# Patient Record
Sex: Female | Born: 1950 | Race: Black or African American | Hispanic: No | Smoking: Former smoker
Health system: Southern US, Community
[De-identification: ages and names within clinical notes are randomized; demographics above are authoritative.]

## PROBLEM LIST (undated history)

## (undated) DIAGNOSIS — E039 Hypothyroidism, unspecified: Secondary | ICD-10-CM

## (undated) DIAGNOSIS — K219 Gastro-esophageal reflux disease without esophagitis: Secondary | ICD-10-CM

## (undated) DIAGNOSIS — K589 Irritable bowel syndrome without diarrhea: Secondary | ICD-10-CM

## (undated) DIAGNOSIS — M1990 Unspecified osteoarthritis, unspecified site: Secondary | ICD-10-CM

## (undated) DIAGNOSIS — E213 Hyperparathyroidism, unspecified: Secondary | ICD-10-CM

## (undated) DIAGNOSIS — E079 Disorder of thyroid, unspecified: Secondary | ICD-10-CM

## (undated) DIAGNOSIS — F0390 Unspecified dementia without behavioral disturbance: Secondary | ICD-10-CM

## (undated) DIAGNOSIS — J45909 Unspecified asthma, uncomplicated: Secondary | ICD-10-CM

## (undated) DIAGNOSIS — I10 Essential (primary) hypertension: Secondary | ICD-10-CM

## (undated) DIAGNOSIS — F329 Major depressive disorder, single episode, unspecified: Secondary | ICD-10-CM

## (undated) DIAGNOSIS — E785 Hyperlipidemia, unspecified: Secondary | ICD-10-CM

## (undated) DIAGNOSIS — F32A Depression, unspecified: Secondary | ICD-10-CM

## (undated) HISTORY — PX: COLONOSCOPY: SHX174

## (undated) HISTORY — DX: Unspecified dementia, unspecified severity, without behavioral disturbance, psychotic disturbance, mood disturbance, and anxiety: F03.90

## (undated) HISTORY — PX: CHOLECYSTECTOMY: SHX55

## (undated) HISTORY — DX: Hyperparathyroidism, unspecified: E21.3

## (undated) HISTORY — PX: VESICOVAGINAL FISTULA CLOSURE W/ TAH: SUR271

---

## 1997-12-12 ENCOUNTER — Other Ambulatory Visit: Admission: RE | Admit: 1997-12-12 | Discharge: 1997-12-12 | Payer: Self-pay | Admitting: Obstetrics

## 1998-06-03 ENCOUNTER — Ambulatory Visit (HOSPITAL_COMMUNITY): Admission: RE | Admit: 1998-06-03 | Discharge: 1998-06-04 | Payer: Self-pay | Admitting: Neurosurgery

## 1998-06-17 ENCOUNTER — Ambulatory Visit (HOSPITAL_COMMUNITY): Admission: RE | Admit: 1998-06-17 | Discharge: 1998-06-17 | Payer: Self-pay | Admitting: Neurosurgery

## 1998-06-17 ENCOUNTER — Encounter: Payer: Self-pay | Admitting: Neurosurgery

## 1998-07-02 ENCOUNTER — Ambulatory Visit (HOSPITAL_COMMUNITY): Admission: RE | Admit: 1998-07-02 | Discharge: 1998-07-02 | Payer: Self-pay | Admitting: Neurosurgery

## 1998-07-02 ENCOUNTER — Encounter: Payer: Self-pay | Admitting: Neurosurgery

## 1998-12-09 ENCOUNTER — Other Ambulatory Visit: Admission: RE | Admit: 1998-12-09 | Discharge: 1998-12-09 | Payer: Self-pay | Admitting: Obstetrics

## 1999-10-08 ENCOUNTER — Encounter: Payer: Self-pay | Admitting: Obstetrics

## 1999-10-08 ENCOUNTER — Encounter: Admission: RE | Admit: 1999-10-08 | Discharge: 1999-10-08 | Payer: Self-pay | Admitting: Obstetrics

## 2000-02-16 ENCOUNTER — Other Ambulatory Visit: Admission: RE | Admit: 2000-02-16 | Discharge: 2000-02-16 | Payer: Self-pay | Admitting: Obstetrics

## 2000-05-07 ENCOUNTER — Ambulatory Visit (HOSPITAL_COMMUNITY): Admission: RE | Admit: 2000-05-07 | Discharge: 2000-05-07 | Payer: Self-pay | Admitting: *Deleted

## 2000-10-08 ENCOUNTER — Encounter: Admission: RE | Admit: 2000-10-08 | Discharge: 2000-10-08 | Payer: Self-pay | Admitting: Obstetrics

## 2000-10-08 ENCOUNTER — Encounter: Payer: Self-pay | Admitting: Obstetrics

## 2000-12-23 ENCOUNTER — Encounter: Payer: Self-pay | Admitting: *Deleted

## 2000-12-23 ENCOUNTER — Ambulatory Visit (HOSPITAL_COMMUNITY): Admission: RE | Admit: 2000-12-23 | Discharge: 2000-12-23 | Payer: Self-pay | Admitting: *Deleted

## 2001-01-27 ENCOUNTER — Other Ambulatory Visit: Admission: RE | Admit: 2001-01-27 | Discharge: 2001-01-27 | Payer: Self-pay | Admitting: Obstetrics

## 2001-05-06 ENCOUNTER — Emergency Department (HOSPITAL_COMMUNITY): Admission: EM | Admit: 2001-05-06 | Discharge: 2001-05-06 | Payer: Self-pay | Admitting: Emergency Medicine

## 2001-05-10 ENCOUNTER — Emergency Department (HOSPITAL_COMMUNITY): Admission: EM | Admit: 2001-05-10 | Discharge: 2001-05-10 | Payer: Self-pay | Admitting: Emergency Medicine

## 2001-10-12 ENCOUNTER — Encounter: Payer: Self-pay | Admitting: Obstetrics

## 2001-10-12 ENCOUNTER — Encounter: Admission: RE | Admit: 2001-10-12 | Discharge: 2001-10-12 | Payer: Self-pay | Admitting: Obstetrics

## 2002-11-09 ENCOUNTER — Encounter: Payer: Self-pay | Admitting: Obstetrics

## 2002-11-09 ENCOUNTER — Ambulatory Visit (HOSPITAL_COMMUNITY): Admission: RE | Admit: 2002-11-09 | Discharge: 2002-11-09 | Payer: Self-pay | Admitting: Obstetrics

## 2003-04-27 ENCOUNTER — Ambulatory Visit (HOSPITAL_COMMUNITY): Admission: RE | Admit: 2003-04-27 | Discharge: 2003-04-27 | Payer: Self-pay | Admitting: *Deleted

## 2003-04-27 ENCOUNTER — Encounter: Payer: Self-pay | Admitting: *Deleted

## 2003-06-12 ENCOUNTER — Ambulatory Visit (HOSPITAL_COMMUNITY): Admission: RE | Admit: 2003-06-12 | Discharge: 2003-06-12 | Payer: Self-pay | Admitting: *Deleted

## 2003-06-12 ENCOUNTER — Encounter: Payer: Self-pay | Admitting: *Deleted

## 2007-06-02 ENCOUNTER — Ambulatory Visit (HOSPITAL_COMMUNITY): Admission: RE | Admit: 2007-06-02 | Discharge: 2007-06-02 | Payer: Self-pay | Admitting: Cardiology

## 2007-11-02 ENCOUNTER — Ambulatory Visit (HOSPITAL_COMMUNITY): Admission: RE | Admit: 2007-11-02 | Discharge: 2007-11-02 | Payer: Self-pay | Admitting: Cardiology

## 2010-01-09 ENCOUNTER — Encounter: Admission: RE | Admit: 2010-01-09 | Discharge: 2010-01-09 | Payer: Self-pay | Admitting: Cardiology

## 2010-01-14 ENCOUNTER — Encounter: Admission: RE | Admit: 2010-01-14 | Discharge: 2010-01-14 | Payer: Self-pay | Admitting: Cardiology

## 2010-05-16 ENCOUNTER — Encounter: Admission: RE | Admit: 2010-05-16 | Discharge: 2010-05-16 | Payer: Self-pay | Admitting: Cardiology

## 2010-08-07 ENCOUNTER — Emergency Department (HOSPITAL_COMMUNITY)
Admission: EM | Admit: 2010-08-07 | Discharge: 2010-08-07 | Payer: Self-pay | Source: Home / Self Care | Admitting: Emergency Medicine

## 2010-09-21 ENCOUNTER — Encounter: Payer: Self-pay | Admitting: Cardiology

## 2010-10-02 ENCOUNTER — Other Ambulatory Visit (HOSPITAL_COMMUNITY): Payer: Self-pay | Admitting: Cardiology

## 2010-10-24 ENCOUNTER — Ambulatory Visit (HOSPITAL_COMMUNITY)
Admission: RE | Admit: 2010-10-24 | Discharge: 2010-10-24 | Disposition: A | Discharge status: Home / Self Care | Payer: Medicaid Other | Source: Ambulatory Visit | Attending: Cardiology | Admitting: Cardiology

## 2010-10-24 ENCOUNTER — Ambulatory Visit (HOSPITAL_COMMUNITY): Payer: Medicaid Other

## 2010-10-24 ENCOUNTER — Ambulatory Visit (HOSPITAL_COMMUNITY): Payer: Self-pay

## 2010-10-24 DIAGNOSIS — R079 Chest pain, unspecified: Secondary | ICD-10-CM | POA: Insufficient documentation

## 2010-10-24 MED ORDER — TECHNETIUM TC 99M TETROFOSMIN IV KIT
10.0000 | PACK | Freq: Once | INTRAVENOUS | Status: AC | PRN
  Administered 2010-10-24: 10 via INTRAVENOUS

## 2010-10-24 MED ORDER — TECHNETIUM TC 99M TETROFOSMIN IV KIT
30.0000 | PACK | Freq: Once | INTRAVENOUS | Status: AC | PRN
  Administered 2010-10-24: 30 via INTRAVENOUS

## 2011-01-02 ENCOUNTER — Ambulatory Visit (HOSPITAL_BASED_OUTPATIENT_CLINIC_OR_DEPARTMENT_OTHER): Payer: Medicaid Other | Attending: Cardiology

## 2011-01-02 DIAGNOSIS — G4730 Sleep apnea, unspecified: Secondary | ICD-10-CM | POA: Insufficient documentation

## 2011-01-02 DIAGNOSIS — G4700 Insomnia, unspecified: Secondary | ICD-10-CM | POA: Insufficient documentation

## 2011-01-02 DIAGNOSIS — R0609 Other forms of dyspnea: Secondary | ICD-10-CM | POA: Insufficient documentation

## 2011-01-02 DIAGNOSIS — R0989 Other specified symptoms and signs involving the circulatory and respiratory systems: Secondary | ICD-10-CM | POA: Insufficient documentation

## 2011-01-10 DIAGNOSIS — G4700 Insomnia, unspecified: Secondary | ICD-10-CM

## 2011-01-10 DIAGNOSIS — R0989 Other specified symptoms and signs involving the circulatory and respiratory systems: Secondary | ICD-10-CM

## 2011-01-10 DIAGNOSIS — R0609 Other forms of dyspnea: Secondary | ICD-10-CM

## 2011-01-10 DIAGNOSIS — G4730 Sleep apnea, unspecified: Secondary | ICD-10-CM

## 2011-01-10 NOTE — Procedures (Signed)
[name], Alejandra Duffy               ACCOUNT NO.:  [id number]  MEDICAL RECORD NO.:  [id number]          PATIENT TYPE:  OUT  LOCATION:  SLEEP CENTER                 FACILITY:  [facility]  PHYSICIAN:  Recia Sons D. Maple Hudson, MD, FCCP, FACPDATE OF BIRTH:  06-[birth year]-49  DATE OF STUDY:  01/02/2011                           NOCTURNAL POLYSOMNOGRAM  REFERRING PHYSICIAN:  JEROME O SPRUILL  INDICATION FOR STUDY:  Insomnia with sleep apnea.  EPWORTH SLEEPINESS SCORE:  02/24, BMI 33.6.  Weight 196 pounds, height 64 inches.  Neck 14 inches.  MEDICATIONS:  Home medications are charted and reviewed.  SLEEP ARCHITECTURE:  Total sleep time 413.5 minutes with sleep efficiency 75.9%.  Stage I 28.8%, stage II 56.6%, stage III absent, REM 14.6% of total sleep time.  Sleep latency 24.5 minutes, REM latency 342.5 minutes, awake after sleep onset 82 minutes, arousal index 16.6. Bedtime medication:  None.  Sleep architecture was marked by frequent brief waking throughout the night resulting in a markedly fragmented sleep pattern.  RESPIRATORY DATA:  Apnea/hypopnea index (AHI) 2.3 per hour.  A total of 13 events was scored including 9 obstructive apneas and 4 hypopneas. RDI 4.1.  REM AHI 14.8 per hour.  There were insufficient events to permit application of CPAP titration by split study protocol on this study night.  OXYGEN DATA:  Mild snoring with oxygen desaturation to a nadir of 89% and a mean oxygen saturation through the study of 94.7% on room air.  CARDIAC DATA:  Normal sinus rhythm.  MOVEMENT/PARASOMNIA:  A few incidental limb jerks were noted with no effect on sleep recognized.  No bathroom trips.  IMPRESSION/RECOMMENDATIONS: 1. Sleep architecture was markedly fragmented throughout the study     night but without objectively recognized respiratory or movement     events.  Suggest managing as insomnia. 2. Occasional respiratory event with sleep disturbance, within normal     limits, AHI 2.3 per hour  (normal range 0 to     5 per hour).  Mild snoring with oxygen desaturation to a nadir of     89% and a mean oxygen saturation through the study of 94.7% on room     air.     Kahdijah Errickson D. Maple Hudson, MD, [facility], FACP Diplomate, Biomedical engineer of Sleep Medicine Electronically Signed    CDY/MEDQ  D:  01/10/2011 10:16:49  T:  01/10/2011 21:34:38  Job:  [id number]

## 2011-01-13 ENCOUNTER — Other Ambulatory Visit: Payer: Self-pay | Admitting: Cardiology

## 2011-01-13 DIAGNOSIS — Z1231 Encounter for screening mammogram for malignant neoplasm of breast: Secondary | ICD-10-CM

## 2011-01-16 NOTE — Procedures (Signed)
[facility]  Patient:    Alejandra Duffy, Alejandra Duffy                      MRN: [id number] Proc. Date: 05/07/00 Adm. Date:  [id number] Attending:  Sharyn Dross CC:         Lindell Spar. Chestine Spore, M.D.   Procedure Report  REFERRING PHYSICIAN:  Lindell Spar. Chestine Spore, M.D.  PREOPERATIVE DIAGNOSIS:  Diarrhea, etiology unknown.  POSTOPERATIVE DIAGNOSIS:  Grossly normal colonoscopic examination, except increased firm stools present in the colon.  PROCEDURE:  Colonoscopy.  MEDICATIONS:  Demerol 100 mg IV, Versed 10 mg IV over 20 minute period of time.  INSTRUMENT:  Olympus video pancolonoscope.  ENDOSCOPIST:  Sharyn Dross., M.D.  INDICATIONS:  This pleasant 60 year old black female was referred for an evaluation because of diarrhea that has been ongoing.  She was seen by her primary physician that subsequently referred her for an evaluation for this process.  The patient denies any history of any blood present in the stools at this time.  There is no evidence of any hematemesis or any melanotic stools as noted.  OBJECTIVE FINDINGS:  She is a pleasant female, who appeared to be in no acute distress.  Her vital signs were stable.  Her HEENT examination was anicteric. NECK was supple.  LUNGS were clear.  HEART had a regular rate and rhythm without heaves, thrills, murmurs or gallops.  The ABDOMEN was soft, no tenderness, no hepatosplenomegaly appreciated.  Digital rectal examination was normal.  EXTREMITIES showed no clubbing, cyanosis, or edema.  PLAN:  I am going to proceed with the colonoscopic examination.  INFORMED CONSENT:  Patient was advised of the procedure, indications, and risks involved.  Patient has agreed to have the procedure performed.  A video was reviewed and consent form was obtained.  PREOPERATIVE PREPARATION:  Patient was brought in the endoscopy unit when an IV for IV sedating medication was started.  A monitor was placed on the patient to  monitor the patients vital signs and oxygen saturation.  Nasal oxygen at approximately 2 L/min was used and after adequate sedation was performed, the procedure was begun.  BOWEL PREP:  No bowel prep was given to the patient because of the diarrhea that was present.  PROCEDURE NOTE:  The instrument was advanced, patient lying in left lateral position, to approximately 130 cm proximal colon to the proximal transverse/ascending region.  There appeared to be no gross abnormalities, such as masses, polyps or stricture lesions appreciated.  The vascular pattern that was visualized appeared to be well within normal limits throughout the entire area that was evaluated.  The mucosal pattern showed no evidence of any granular changes or diverticular changes noted.  There was however, large amounts of stool that was present.  These amounts were in varied positions at this time.  Some areas were clear and other areas were packed with stools that was present.  There was no increased fluid that was present with the stools that were noted, thus the stools were very firm in character at this time.  There was no increased tortuosity of the colon that was noted.  As the instrument was advanced more proximally, there was more and more stool that was present.  Thus, the procedure was halted because of poor visualization that was noted.  The instrument was retracted back with no other gross abnormalities noted and the instrument was removed per rectum without difficulty with the patient tolerating the  procedure well.  TREATMENT: 1. I am going to recommend a stool evacuating system to empty her out at this    time. 2. I am going to also recommend high-fiber diet at this time. 3. A prokinetic would benefit her at this point.  However, based on the fact    that we presently do not have one available would just observe her and    depending on these results will determine the course of therapy.  LEVEL OF  DIFFICULTY:  The level of difficulty was 3/5.  RECOMMENDATIONS: 1. Would recommend because of some increased tortuosity that was present, the    pediatric scope may be of more benefit. 2. Based on her thrashing about during the procedure would recommend possibly    anesthesia to help with repeating this procedure as deemed necessary. DD:  05/07/00 TD:  05/08/00 Job: [id number]

## 2011-02-18 ENCOUNTER — Ambulatory Visit: Payer: Medicaid Other

## 2011-03-06 ENCOUNTER — Ambulatory Visit
Admission: RE | Admit: 2011-03-06 | Discharge: 2011-03-06 | Disposition: A | Discharge status: Home / Self Care | Payer: Medicaid Other | Source: Ambulatory Visit | Attending: Cardiology | Admitting: Cardiology

## 2011-03-06 DIAGNOSIS — Z1231 Encounter for screening mammogram for malignant neoplasm of breast: Secondary | ICD-10-CM

## 2011-05-25 LAB — URINALYSIS, MICROSCOPIC ONLY
Glucose, UA: NEGATIVE
Ketones, ur: 15 — AB
Urobilinogen, UA: 1
pH: 6

## 2011-05-25 LAB — CBC
Hemoglobin: 14
MCHC: 33.9
MCV: 88.2
RBC: 4.69
WBC: 5.1

## 2011-05-25 LAB — COMPREHENSIVE METABOLIC PANEL
ALT: 34
Calcium: 8.3 — ABNORMAL LOW
GFR calc Af Amer: >60
Potassium: 3.2 — ABNORMAL LOW
Sodium: 137
Total Bilirubin: 0.5
Total Protein: 6.2

## 2011-05-25 LAB — LIPID PANEL
Cholesterol: 116
LDL Cholesterol: 43
Total CHOL/HDL Ratio: 4.8

## 2011-05-25 LAB — TSH: TSH: 2.576

## 2011-11-27 ENCOUNTER — Emergency Department (HOSPITAL_COMMUNITY)
Admission: EM | Admit: 2011-11-27 | Discharge: 2011-11-27 | Disposition: A | Discharge status: Home / Self Care | Payer: Medicaid Other | Attending: Emergency Medicine | Admitting: Emergency Medicine

## 2011-11-27 ENCOUNTER — Encounter (HOSPITAL_COMMUNITY): Payer: Self-pay | Admitting: *Deleted

## 2011-11-27 ENCOUNTER — Emergency Department (HOSPITAL_COMMUNITY): Payer: Medicaid Other

## 2011-11-27 DIAGNOSIS — E119 Type 2 diabetes mellitus without complications: Secondary | ICD-10-CM | POA: Insufficient documentation

## 2011-11-27 DIAGNOSIS — M79609 Pain in unspecified limb: Secondary | ICD-10-CM | POA: Insufficient documentation

## 2011-11-27 DIAGNOSIS — M79676 Pain in unspecified toe(s): Secondary | ICD-10-CM

## 2011-11-27 DIAGNOSIS — I10 Essential (primary) hypertension: Secondary | ICD-10-CM | POA: Insufficient documentation

## 2011-11-27 DIAGNOSIS — E785 Hyperlipidemia, unspecified: Secondary | ICD-10-CM | POA: Insufficient documentation

## 2011-11-27 DIAGNOSIS — W19XXXA Unspecified fall, initial encounter: Secondary | ICD-10-CM | POA: Insufficient documentation

## 2011-11-27 HISTORY — DX: Hyperlipidemia, unspecified: E78.5

## 2011-11-27 HISTORY — DX: Essential (primary) hypertension: I10

## 2011-11-27 HISTORY — DX: Disorder of thyroid, unspecified: E07.9

## 2011-11-27 MED ORDER — OXYCODONE-ACETAMINOPHEN 5-325 MG PO TABS
1.0000 | ORAL_TABLET | Freq: Once | ORAL | Status: AC
  Administered 2011-11-27: 1 via ORAL
  Filled 2011-11-27: qty 1

## 2011-11-27 MED ORDER — OXYCODONE-ACETAMINOPHEN 5-325 MG PO TABS: 1.0000 | ORAL_TABLET | Freq: Four times a day (QID) | ORAL | Status: AC | PRN

## 2011-11-27 NOTE — ED Notes (Signed)
Pt now in room.   

## 2011-11-27 NOTE — ED Notes (Signed)
Pt not in room x2 

## 2011-11-27 NOTE — ED Notes (Signed)
Pt not found in room for d/c.

## 2011-11-27 NOTE — ED Notes (Signed)
Ortho Tech coming to provide crutches to pt.

## 2011-11-27 NOTE — ED Notes (Signed)
Patient transported to X-ray 

## 2011-11-27 NOTE — Discharge Instructions (Signed)
Conservative treatment includes the following home care instructions. HOME CARE INSTRUCTIONS   Apply ice to the sore area for 15 to 20 minutes, 3 to 4 times per day while awake, for the first 4 days. Put the ice in a plastic bag and place a towel between the bag of ice and your skin. Use ice if possible following any activities, even after the first four days.   Keep your leg elevated when possible to lessen swelling and discomfort in the toe.   Use crutches with non-weight bearing on the affected foot for ten days, or as needed for pain. Then you may walk as the pain allows, or as instructed. Start gradually with weight bearing on the affected foot. Shoes with stiff soles will generally be helpful in limiting pain for the first 1 to 2 weeks.   Continue to use crutches or a cane until you can stand on your foot without causing pain.   Only take over-the-counter or prescription medicines for pain, discomfort, or fever as directed by your caregiver.  SEEK IMMEDIATE MEDICAL CARE IF:   You have an increase in bruising, swelling, or pain in your toe.   Pain relief is not obtained with medications.  Document Released: 02/06/2002 Document Revised: 08/06/2011 Document Reviewed: 01/22/2009 [facility] Patient Information 2012 [place], [facility]  Crutch Use You have been prescribed crutches to take weight off one of your lower legs or feet (extremities). When using crutches, make sure you are not putting pressure on the armpit (axilla). This could cause damage to the nerves that extend from your axilla to the hand and arm. When fitted properly the crutches should be 2 to 3 finger widths below the axilla. Your weight should be supported by your hand, and not by resting upon the crutch with the axilla. When walking, first step with the crutches, then swing the healthy leg through and slightly ahead. When going up stairs, first step up with the healthy leg and then follow with the crutches and injured leg up to  the same step, and so forth. If there is a handrail, hold both crutches in one hand, place your other hand on the handrail, and while placing your weight on your arms, lift your good leg to the step, then bring the crutches and the injured leg up to that step. Repeat for each step. When going down stairs, first step with the injured leg and crutches, following down with the healthy leg to the same step. Be very careful, as going down stairs with crutches is very challenging. If you feel wobbly or nervous, sit down and inch yourself down the stairs on your butt. To get up from a chair, hold injured leg forward, grab armrest with one hand and the top of the crutches with the other hand. Using these supports, pull yourself up to a standing position. Reverse this procedure for sitting. See your caregiver for follow up as suggested. If you are discharged in an ace wrap and develop numbness, tingling, swelling, or increased pain, loosen the ace wrap and re-wrap looser. If these problems persist, see your caregiver as needed. If you have been instructed to use partial weight bearing, bear (apply) the amount of weight as suggested by your caregiver. Do not bear weight in an amount that causes pain on the area of injury. Document Released: 08/14/2000 Document Revised: 08/06/2011 Document Reviewed: 10/22/2008 [facility] Patient Information 2012 [place], [place].Marland Kitchen

## 2011-11-27 NOTE — ED Notes (Signed)
Pt undischarged, pt states she was never called back to a room, will place pt in CDU 9

## 2011-11-27 NOTE — ED Provider Notes (Signed)
Medical screening examination/treatment/procedure(s) were performed by non-physician practitioner and as supervising physician I was immediately available for consultation/collaboration.    Nelia Shi, MD 11/27/11 [phone]

## 2011-11-27 NOTE — Progress Notes (Signed)
Orthopedic Tech Progress Note Patient Details:  Alejandra Duffy 31-Dec-1950 [id number]  Other Ortho Devices Type of Ortho Device: Crutches Ortho Device Location: (R) LE Ortho Device Interventions: Casandra Doffing 11/27/2011, 5:59 PM

## 2011-11-27 NOTE — ED Provider Notes (Signed)
History     CSN: [id number]  Arrival date & time 11/27/11  1257   First MD Initiated Contact with Patient 11/27/11 1356      Chief Complaint  Patient presents with  . Fall  . Foot Pain    (Consider location/radiation/quality/duration/timing/severity/associated sxs/prior treatment) Patient is a 61 y.o. female presenting with fall and lower extremity pain. The history is provided by the patient. No language interpreter was used.  Fall The accident occurred yesterday. The fall occurred while walking. She fell from a height of 1 to 2 ft. She landed on a hard floor. There was no blood loss. The pain is moderate. She was ambulatory at the scene. There was no entrapment after the fall. There was no drug use involved in the accident. There was no alcohol use involved in the accident. The symptoms are aggravated by activity. She has tried nothing for the symptoms.  Foot Pain This is a new problem. The current episode started yesterday. The problem occurs intermittently. The problem has been gradually worsening. Associated symptoms include arthralgias. Pertinent negatives include no joint swelling. The symptoms are aggravated by walking. She has tried rest for the symptoms. The treatment provided no relief.    Past Medical History  Diagnosis Date  . Hypertension   . Diabetes mellitus   . Gout   . Thyroid disease   . Hyperlipemia     History reviewed. No pertinent past surgical history.  No family history on file.  History  Substance Use Topics  . Smoking status: Never Smoker   . Smokeless tobacco: Not on file  . Alcohol Use: No    OB History    Grav Para Term Preterm Abortions TAB SAB Ect Mult Living                  Review of Systems  Musculoskeletal: Positive for arthralgias. Negative for joint swelling.  All other systems reviewed and are negative.    Allergies  Tetracyclines & related  Home Medications   Current Outpatient Rx  Name Route Sig Dispense Refill  .  CARVEDILOL 25 MG PO TABS Oral Take 25 mg by mouth 2 (two) times daily with a meal.    . CHLORHEXIDINE GLUCONATE 0.12 % MT SOLN Mouth/Throat Use as directed 15 mLs in the mouth or throat as needed.    Marland Kitchen CITALOPRAM HYDROBROMIDE 40 MG PO TABS Oral Take 40 mg by mouth daily.    . COLCHICINE 0.6 MG PO TABS Oral Take 0.6 mg by mouth 2 (two) times daily as needed.    . CYCLOBENZAPRINE HCL 10 MG PO TABS Oral Take 10 mg by mouth at bedtime as needed. For muscle pain    . DICYCLOMINE HCL 20 MG PO TABS Oral Take 20 mg by mouth 2 (two) times daily.    Marland Kitchen GLIMEPIRIDE 4 MG PO TABS Oral Take 4 mg by mouth 2 (two) times daily.    Marland Kitchen HYDROCODONE-ACETAMINOPHEN 7.5-500 MG PO TABS Oral Take 1 tablet by mouth every 6 (six) hours as needed. For pain    . HYDROCORTISONE 2.5 % EX CREA Topical Apply 1 application topically 2 (two) times daily as needed.    Marland Kitchen LEVOTHYROXINE SODIUM 50 MCG PO TABS Oral Take 50 mcg by mouth daily.    Marland Kitchen LORAZEPAM 1 MG PO TABS Oral Take 1 mg by mouth daily.    Marland Kitchen OMEPRAZOLE 20 MG PO CPDR Oral Take 20 mg by mouth 2 (two) times daily.    Marland Kitchen SIMVASTATIN 40  MG PO TABS Oral Take 40 mg by mouth every evening.    Marland Kitchen SITAGLIPTIN PHOSPHATE 100 MG PO TABS Oral Take 100 mg by mouth daily.    . TRAMADOL HCL 50 MG PO TABS Oral Take 50 mg by mouth every 6 (six) hours as needed. For pain    . TRIAMTERENE-HCTZ 37.5-25 MG PO TABS Oral Take 1 tablet by mouth daily.      BP 91/62  Pulse 94  Temp(Src) 97.8 F (36.6 C) (Oral)  Resp 20  Ht 5\' 4"  (1.626 m)  Wt 180 lb (81.647 kg)  BMI 30.90 kg/m2  SpO2 96%  Physical Exam  Nursing note and vitals reviewed. Constitutional: She is oriented to person, place, and time. She appears well-developed and well-nourished.  HENT:  Head: Normocephalic and atraumatic.  Eyes: Conjunctivae are normal. Pupils are equal, round, and reactive to light.  Neck: Normal range of motion. Neck supple.  Cardiovascular: Normal rate, regular rhythm, normal heart sounds and intact distal  pulses.   Pulmonary/Chest: Effort normal and breath sounds normal.  Abdominal: Soft. Bowel sounds are normal.  Musculoskeletal: She exhibits tenderness.       Right foot: She exhibits decreased range of motion and tenderness. She exhibits no swelling, normal capillary refill and no deformity.       Feet:  Lymphadenopathy:    She has no cervical adenopathy.  Neurological: She is alert and oriented to person, place, and time.  Skin: Skin is warm and dry.  Psychiatric: She has a normal mood and affect. Her behavior is normal. Judgment and thought content normal.    ED Course  Procedures (including critical care time)  Labs Reviewed - No data to display No results found.   No diagnosis found.  3:01 PM Radiology results reviewed--no fracture or other bony abnormality noted.  Patient not currently in room.  3:26 PM  Patient not in room, and has not been located in the department.  Apparently left post-evaluation.  5:38 PM Patient seen after being located in waiting area.  Results discussed with patient.  Will treat at turf toe.  Patient is on chronic pain medication at home.  MDM          Jimmye Norman, NP 11/27/11 1527  Jimmye Norman, NP 11/27/11 1744

## 2011-11-27 NOTE — ED Notes (Signed)
Reports last night, missed a step going down & landed on toes. Denies other injuries.  C/o right great toe pain. No obvious deformity, edema noted. Ambulatory from triage

## 2011-11-27 NOTE — ED Notes (Signed)
Patient reports she missed stepped on yesterday and hurt her right great toe.  She denies any other injuries

## 2011-11-27 NOTE — ED Notes (Signed)
Pt not in room.

## 2012-01-22 ENCOUNTER — Other Ambulatory Visit: Payer: Self-pay | Admitting: Cardiology

## 2012-01-22 DIAGNOSIS — Z1231 Encounter for screening mammogram for malignant neoplasm of breast: Secondary | ICD-10-CM

## 2012-02-03 ENCOUNTER — Other Ambulatory Visit (HOSPITAL_COMMUNITY): Payer: Self-pay | Admitting: Cardiology

## 2012-02-03 DIAGNOSIS — R079 Chest pain, unspecified: Secondary | ICD-10-CM

## 2012-02-08 ENCOUNTER — Ambulatory Visit (HOSPITAL_COMMUNITY)
Admission: RE | Admit: 2012-02-08 | Discharge: 2012-02-08 | Disposition: A | Discharge status: Home / Self Care | Payer: Medicaid Other | Source: Ambulatory Visit | Attending: Cardiology | Admitting: Cardiology

## 2012-02-08 ENCOUNTER — Encounter (HOSPITAL_COMMUNITY)
Admission: RE | Admit: 2012-02-08 | Discharge: 2012-02-08 | Disposition: A | Discharge status: Home / Self Care | Payer: Medicaid Other | Source: Ambulatory Visit | Attending: Cardiology | Admitting: Cardiology

## 2012-02-08 ENCOUNTER — Other Ambulatory Visit: Payer: Self-pay

## 2012-02-08 DIAGNOSIS — I10 Essential (primary) hypertension: Secondary | ICD-10-CM | POA: Insufficient documentation

## 2012-02-08 DIAGNOSIS — E119 Type 2 diabetes mellitus without complications: Secondary | ICD-10-CM | POA: Insufficient documentation

## 2012-02-08 DIAGNOSIS — R079 Chest pain, unspecified: Secondary | ICD-10-CM

## 2012-02-08 MED ORDER — REGADENOSON 0.4 MG/5ML IV SOLN
INTRAVENOUS | Status: AC
  Administered 2012-02-08: 0.4 mg via INTRAVENOUS
  Filled 2012-02-08: qty 5

## 2012-02-08 MED ORDER — TECHNETIUM TC 99M TETROFOSMIN IV KIT
30.0000 | PACK | Freq: Once | INTRAVENOUS | Status: AC | PRN
  Administered 2012-02-08: 30 via INTRAVENOUS

## 2012-02-08 MED ORDER — REGADENOSON 0.4 MG/5ML IV SOLN
0.4000 mg | Freq: Once | INTRAVENOUS | Status: AC
  Administered 2012-02-08: 0.4 mg via INTRAVENOUS

## 2012-02-08 MED ORDER — TECHNETIUM TC 99M TETROFOSMIN IV KIT
10.0000 | PACK | Freq: Once | INTRAVENOUS | Status: AC | PRN
  Administered 2012-02-08: 10 via INTRAVENOUS

## 2012-03-08 ENCOUNTER — Ambulatory Visit
Admission: RE | Admit: 2012-03-08 | Discharge: 2012-03-08 | Disposition: A | Discharge status: Home / Self Care | Payer: Medicaid Other | Source: Ambulatory Visit | Attending: Cardiology | Admitting: Cardiology

## 2012-03-08 DIAGNOSIS — Z1231 Encounter for screening mammogram for malignant neoplasm of breast: Secondary | ICD-10-CM

## 2012-03-14 ENCOUNTER — Encounter: Payer: Self-pay | Admitting: Pulmonary Disease

## 2012-03-15 ENCOUNTER — Institutional Professional Consult (permissible substitution): Payer: Medicaid Other | Admitting: Internal Medicine

## 2012-04-01 ENCOUNTER — Encounter: Payer: Self-pay | Admitting: *Deleted

## 2012-04-04 ENCOUNTER — Encounter: Payer: Self-pay | Admitting: Internal Medicine

## 2012-04-04 ENCOUNTER — Ambulatory Visit (INDEPENDENT_AMBULATORY_CARE_PROVIDER_SITE_OTHER): Payer: Medicaid Other | Admitting: Internal Medicine

## 2012-04-04 VITALS — BP 112/80 | HR 89 | Temp 97.9°F | Ht 64.0 in | Wt 197.4 lb

## 2012-04-04 DIAGNOSIS — R059 Cough, unspecified: Secondary | ICD-10-CM | POA: Insufficient documentation

## 2012-04-04 DIAGNOSIS — R05 Cough: Secondary | ICD-10-CM

## 2012-04-04 DIAGNOSIS — I10 Essential (primary) hypertension: Secondary | ICD-10-CM | POA: Insufficient documentation

## 2012-04-04 DIAGNOSIS — E1159 Type 2 diabetes mellitus with other circulatory complications: Secondary | ICD-10-CM | POA: Insufficient documentation

## 2012-04-04 MED ORDER — VALSARTAN 80 MG PO TABS: 80.0000 mg | ORAL_TABLET | Freq: Every day | ORAL | Status: DC

## 2012-04-04 NOTE — Progress Notes (Signed)
  Subjective:    Patient ID: Alejandra Duffy, female    DOB: [birth year]   MRN: [id number]  HPI  61 yobf never regular smoker with h/o sinus infections x decades then " turned into asthma" early 2000s mostly rx with abx and neb daily then worse symptoms of cough around May 2013 so referred 04/04/2012 to pulmonary clinic by  Margaretmary Bayley PA/ Dr Melven Sartorius at [facility]   04/04/2012 1st pulmonary eval on ace cc difficult to control cough x 3 month 24 h per day but worse daytime when get in car and when lie down at night > min prod slt beige mucus rx zpack and tessilon not effective so changed to liquid cough medication that worked until she ran out of it.  No better now with neb albuterol whereas previously helped quite a bit.  No overt sinus or reflux symptoms, no flare of "sinus infection" assoc with the cough but is much more hoarse now.  Sleeping ok without   early am exacerbation  Of cough/ wheeze or other respiratory  c/o's or need for noct saba. Also denies any obvious fluctuation of symptoms with weather or environmental changes or other aggravating or alleviating factors except as outlined above   Review of Systems  Constitutional: Negative for fever, chills and unexpected weight change.  HENT: Negative for ear pain, nosebleeds, congestion, sore throat, rhinorrhea, sneezing, trouble swallowing, dental problem, voice change, postnasal drip and sinus pressure.   Eyes: Negative for visual disturbance.  Respiratory: Positive for cough and shortness of breath. Negative for choking.   Cardiovascular: Negative for chest pain and leg swelling.  Gastrointestinal: Positive for abdominal pain. Negative for vomiting and diarrhea.  Genitourinary: Negative for difficulty urinating.  Musculoskeletal: Positive for arthralgias.  Skin: Negative for rash.  Neurological: Negative for tremors, syncope and headaches.  Hematological: Does not bruise/bleed easily.       Objective:   Physical Exam  Wt  Readings from Last 3 Encounters:  04/04/12 197 lb 6.4 oz (89.54 kg)  11/27/11 180 lb (81.647 kg)    Very hoarse bf with classic voice fatigue with mild voice fatigue   HEENT: nl dentition, turbinates, and orophanx. Nl external ear canals without cough reflex   NECK :  without JVD/Nodes/TM/ nl carotid upstrokes bilaterally   LUNGS: no acc muscle use, clear to A and P bilaterally without cough on insp or exp maneuvers   CV:  RRR  no s3 or murmur or increase in P2, no edema   ABD:  soft and nontender with nl excursion in the supine position. No bruits or organomegaly, bowel sounds nl  MS:  warm without deformities, calf tenderness, cyanosis or clubbing  SKIN: warm and dry without lesions    NEURO:  alert, approp, no deficits          Assessment & Plan:

## 2012-04-04 NOTE — Patient Instructions (Addendum)
Stop lisinopril and replace it with diovan (valsartan) 80 mg one daily (we will phone it in)  Change omeprazole 20 mg Take 30- 60 min before your first and last meals of the day   GERD (REFLUX)  is an extremely common cause of respiratory symptoms, many times with no significant heartburn at all.    It can be treated with medication, but also with lifestyle changes including avoidance of late meals, excessive alcohol, smoking cessation, and avoid fatty foods, chocolate, peppermint, colas, red wine, and acidic juices such as orange juice.  NO MINT OR MENTHOL PRODUCTS SO NO COUGH DROPS  USE SUGARLESS CANDY INSTEAD (jolley ranchers or Stover's)  NO OIL BASED VITAMINS - use powdered substitutes.   If still coughing ok to use tramadol to control it but it should it go away after a week or two.  Please schedule a follow up office visit in 4 weeks, sooner if needed

## 2012-04-04 NOTE — Assessment & Plan Note (Signed)
ACE inhibitors are problematic in  pts with airway complaints because  even experienced pulmonologists can't always distinguish ace effects from copd/asthma.  By themselves they don't actually cause a problem, much like oxygen can't by itself start a fire, but they certainly serve as a powerful catalyst or enhancer for any "fire"  or inflammatory process in the upper airway, be it caused by an ET  tube or more commonly reflux (especially in the obese or pts with known GERD or who are on biphoshonates).    In the era of ARB near equivalency until we have a better handle on the reversibility of the airway problem, it just makes sense to avoid ACEI  entirely in the short run and then decide later, having established a level of airway control using a reasonable limited regimen, whether to add back ace but even then being very careful to observe the pt for worsening airway control and number of meds used/ needed to control symptoms.    For now change to Diovan generic 80 mg one daily

## 2012-04-04 NOTE — Assessment & Plan Note (Signed)
The most common causes of chronic cough in immunocompetent adults include the following: upper airway cough syndrome (UACS), previously referred to as postnasal drip syndrome (PNDS), which is caused by variety of rhinosinus conditions; (2) asthma; (3) GERD; (4) chronic bronchitis from cigarette smoking or other inhaled environmental irritants; (5) nonasthmatic eosinophilic bronchitis; and (6) bronchiectasis.   These conditions, singly or in combination, have accounted for up to 94% of the causes of chronic cough in prospective studies.   Other conditions have constituted no >6% of the causes in prospective studies These have included bronchogenic carcinoma, chronic interstitial pneumonia, sarcoidosis, left ventricular failure, ACEI-induced cough, and aspiration from a condition associated with pharyngeal dysfunction.   chronic cough is often simultaneously caused by more than one condition. A single cause has been found from 38 to 82% of the time, multiple causes from 18 to 62%. Multiply caused cough has been the result of three diseases up to 42% of the time.     Most likely this is  Classic Upper airway cough syndrome, so named because it's frequently impossible to sort out how much is  CR/sinusitis with freq throat clearing (which can be related to primary GERD)   vs  causing  secondary (" extra esophageal")  GERD from wide swings in gastric pressure that occur with throat clearing, often  promoting self use of mint and menthol lozenges that reduce the lower esophageal sphincter tone and exacerbate the problem further in a cyclical fashion.   These are the same pts (now being labeled as having "irritable larynx syndrome" by some cough centers) who not infrequently have a history of having failed to tolerate ace inhibitors,  dry powder inhalers or biphosphonates or report having atypical reflux symptoms that don't respond to standard doses of PPI , and are easily confused as having aecopd or asthma  flares by even experienced allergists/ pulmonologists.  For now needs trial off ACEI then regroup in 4-6 weeks.

## 2012-05-04 ENCOUNTER — Other Ambulatory Visit (INDEPENDENT_AMBULATORY_CARE_PROVIDER_SITE_OTHER): Payer: Medicaid Other

## 2012-05-04 ENCOUNTER — Encounter: Payer: Self-pay | Admitting: Internal Medicine

## 2012-05-04 ENCOUNTER — Ambulatory Visit (INDEPENDENT_AMBULATORY_CARE_PROVIDER_SITE_OTHER): Payer: Medicaid Other | Admitting: Internal Medicine

## 2012-05-04 VITALS — BP 118/80 | HR 82 | Temp 98.0°F | Ht 64.0 in | Wt 199.6 lb

## 2012-05-04 DIAGNOSIS — R0609 Other forms of dyspnea: Secondary | ICD-10-CM

## 2012-05-04 DIAGNOSIS — I10 Essential (primary) hypertension: Secondary | ICD-10-CM

## 2012-05-04 DIAGNOSIS — R0600 Dyspnea, unspecified: Secondary | ICD-10-CM

## 2012-05-04 DIAGNOSIS — R0989 Other specified symptoms and signs involving the circulatory and respiratory systems: Secondary | ICD-10-CM

## 2012-05-04 DIAGNOSIS — R05 Cough: Secondary | ICD-10-CM

## 2012-05-04 LAB — CBC WITH DIFFERENTIAL/PLATELET
Basophils Relative: 0.8 % (ref 0.0–3.0)
Eosinophils Absolute: 0.1 10*3/uL (ref 0.0–0.7)
Eosinophils Relative: 2.3 % (ref 0.0–5.0)
HCT: 35.9 % — ABNORMAL LOW (ref 36.0–46.0)
Hemoglobin: 11.7 g/dL — ABNORMAL LOW (ref 12.0–15.0)
Lymphs Abs: 1.6 10*3/uL (ref 0.7–4.0)
MCHC: 32.4 g/dL (ref 30.0–36.0)
MCV: 89.7 fl (ref 78.0–100.0)
Monocytes Absolute: 0.4 10*3/uL (ref 0.1–1.0)
Neutro Abs: 3 10*3/uL (ref 1.4–7.7)
RBC: 4.01 Mil/uL (ref 3.87–5.11)
WBC: 5.1 10*3/uL (ref 4.5–10.5)

## 2012-05-04 LAB — BASIC METABOLIC PANEL
BUN: 8 mg/dL (ref 6–23)
Creatinine, Ser: 1 mg/dL (ref 0.4–1.2)
GFR: 76.41 mL/min (ref >60.00)
Glucose, Bld: 122 mg/dL — ABNORMAL HIGH (ref 70–99)

## 2012-05-04 LAB — TSH: TSH: 1.03 u[IU]/mL (ref 0.35–5.50)

## 2012-05-04 MED ORDER — NEBIVOLOL HCL 20 MG PO TABS: ORAL_TABLET | ORAL | Status: DC

## 2012-05-04 MED ORDER — TRAMADOL HCL 50 MG PO TABS: 50.0000 mg | ORAL_TABLET | Freq: Four times a day (QID) | ORAL | Status: DC | PRN

## 2012-05-04 NOTE — Patient Instructions (Addendum)
Stop coreg  Start bystolic 20 mg one daily  Take delsym two tsp every 12 hours and supplement if needed with  tramadol 50 mg up to 2 every 4 hours to suppress the urge to cough. Swallowing water or using ice chips/non mint and menthol containing candies (such as lifesavers or sugarless jolly ranchers) are also effective.  You should rest your voice and avoid activities that you know make you cough.  Once you have eliminated the cough for 3 straight days try reducing the tramadol first,  then the delsym as tolerated.    Please schedule a follow up office visit in 4 weeks, sooner if needed with pft's Late add needs cxr also

## 2012-05-04 NOTE — Progress Notes (Signed)
  Subjective:    Patient ID: Alejandra Duffy, female    DOB: 1950-09-01   MRN: [id number]  HPI  47 yobf never regular smoker with h/o sinus infections x decades then " turned into asthma" early 2000s mostly rx with abx and neb daily then worse symptoms of cough around May 2013 so referred 04/04/2012 to pulmonary clinic by  Margaretmary Bayley PA/ Dr Melven Sartorius at [facility]   04/04/2012 1st pulmonary eval on acei cc difficult to control cough x 3 month 24 h per day but worse daytime when get in car and when lie down at night > min prod slt beige mucus rx zpack and tessilon not effective so changed to liquid cough medication that worked until she ran out of it.  No better now with neb albuterol whereas previously helped quite a bit.  No overt sinus or reflux symptoms, no flare of "sinus infection" assoc with the cough but is much more hoarse now. rec Stop lisinopril and replace it with diovan (valsartan) 80 mg one daily (we will phone it in) Change omeprazole 20 mg Take 30- 60 min before your first and last meals of the day  GERD  diet. If still coughing ok to use tramadol to control it but it should it go away after a week or two   05/04/2012 f/u ov/[name] cc cough a lot better not using tramadol x for pain. Doe x cleaning x years, more in fall, some better p inhaler despite coreg x years. Not sure what came first,the sob or the coreg.  No obvious daytime variabilty or assoc excess sputum production or cp or chest tightness, subjective wheeze overt sinus or hb symptoms. No unusual exp hx or h/o childhood pna/ asthma or premature birth to his knowledge.     Sleeping ok without   early am exacerbation  Of cough/ wheeze or other respiratory  c/o's or need for noct saba. Also denies any obvious fluctuation of symptoms with weather or environmental changes or other aggravating or alleviating factors except as outlined above   .       Objective:   Physical Exam  Wt 05/04/2012  Wt Readings from Last 3  Encounters:  04/04/12 197 lb 6.4 oz (89.54 kg)  11/27/11 180 lb (81.647 kg)    Min hoarse bf  with mild voice fatigue   HEENT: nl dentition, turbinates, and orophanx. Nl external ear canals without cough reflex   NECK :  without JVD/Nodes/TM/ nl carotid upstrokes bilaterally   LUNGS: no acc muscle use, clear to A and P bilaterally without cough on insp or exp maneuvers   CV:  RRR  no s3 or murmur or increase in P2, no edema   ABD:  soft and nontender with nl excursion in the supine position. No bruits or organomegaly, bowel sounds nl  MS:  warm without deformities, calf tenderness, cyanosis or clubbing       05/04/12 Labs All  nl including hc03 tsh and bnp, hgb       Assessment & Plan:

## 2012-05-05 NOTE — Assessment & Plan Note (Addendum)
All labs nl including hc03 tsh and bnp, hgb   Symptoms are markedly disproportionate to objective findings and not clear this is a lung problem but pt does appear to have difficult airway management issues. DDX of  difficult airways managment all start with A and  include Adherence, Ace Inhibitors, Acid Reflux, Active Sinus Disease, Alpha 1 Antitripsin deficiency, Anxiety masquerading as Airways dz,  ABPA,  allergy(esp in young), Aspiration (esp in elderly), Adverse effects of DPI,  Active smokers, plus two Bs  = Bronchiectasis and Beta blocker use..and one C= CHF  ? Acid reflux > rx plus diet reviewed  ? Beta blocker effect > change to bystolic trial basis

## 2012-05-05 NOTE — Assessment & Plan Note (Signed)
Strongly prefer in this setting: Bystolic, the most beta -1  selective Beta blocker available in sample form, with bisoprolol the most selective generic choice  on the market.   Will rx with bystolic 10 mg daily and return for pft's in 4 weeks

## 2012-05-05 NOTE — Progress Notes (Signed)
Quick Note:  Spoke with pt and notified of results per Dr. Wert. Pt verbalized understanding and denied any questions.  ______ 

## 2012-05-05 NOTE — Assessment & Plan Note (Signed)
-   Trial off ACEI  04/04/2012 >>> cough improved, hoarsness better, so maintain off acei indefinitely

## 2012-05-17 ENCOUNTER — Encounter (HOSPITAL_COMMUNITY): Payer: Self-pay | Admitting: *Deleted

## 2012-05-17 ENCOUNTER — Emergency Department (HOSPITAL_COMMUNITY)
Admission: EM | Admit: 2012-05-17 | Discharge: 2012-05-17 | Disposition: A | Discharge status: Home / Self Care | Payer: Medicaid Other | Attending: Emergency Medicine | Admitting: Emergency Medicine

## 2012-05-17 DIAGNOSIS — I10 Essential (primary) hypertension: Secondary | ICD-10-CM | POA: Insufficient documentation

## 2012-05-17 DIAGNOSIS — E119 Type 2 diabetes mellitus without complications: Secondary | ICD-10-CM | POA: Insufficient documentation

## 2012-05-17 DIAGNOSIS — Z881 Allergy status to other antibiotic agents status: Secondary | ICD-10-CM | POA: Insufficient documentation

## 2012-05-17 DIAGNOSIS — Z825 Family history of asthma and other chronic lower respiratory diseases: Secondary | ICD-10-CM | POA: Insufficient documentation

## 2012-05-17 DIAGNOSIS — Z87891 Personal history of nicotine dependence: Secondary | ICD-10-CM | POA: Insufficient documentation

## 2012-05-17 DIAGNOSIS — H6090 Unspecified otitis externa, unspecified ear: Secondary | ICD-10-CM

## 2012-05-17 DIAGNOSIS — H60399 Other infective otitis externa, unspecified ear: Secondary | ICD-10-CM | POA: Insufficient documentation

## 2012-05-17 DIAGNOSIS — Z8489 Family history of other specified conditions: Secondary | ICD-10-CM | POA: Insufficient documentation

## 2012-05-17 MED ORDER — OXYCODONE-ACETAMINOPHEN 5-325 MG PO TABS: 2.0000 | ORAL_TABLET | ORAL | Status: DC | PRN

## 2012-05-17 MED ORDER — NEOMYCIN-POLYMYXIN-HC 3.5-10000-1 OT SUSP: 4.0000 [drp] | Freq: Three times a day (TID) | OTIC | Status: DC

## 2012-05-17 MED ORDER — NEOMYCIN-POLYMYXIN-HC 3.5-10000-1 OT SUSP
4.0000 [drp] | Freq: Three times a day (TID) | OTIC | Status: DC
Start: 2012-05-17 — End: 2017-04-16

## 2012-05-17 NOTE — ED Notes (Signed)
Pt from home with reports of left ear pain that started on Sunday, pt also endorses balance being off as well as hx of the same but in the right ear and was told that she had an ear infection.

## 2012-05-23 NOTE — ED Provider Notes (Signed)
History     CSN: [id number]  Arrival date & time 05/17/12  1226   First MD Initiated Contact with Patient 05/17/12 1308      Chief Complaint  Patient presents with  . Otalgia    left    (Consider location/radiation/quality/duration/timing/severity/associated sxs/prior treatment) HPI Comments: Alejandra Duffy 61 y.o. female   The chief complaint is: Patient presents with:   Otalgia - left    Patient with history of recurrent otitis externa presents today with cc of ear infection. Patient reports pain and drainage form left ear since Sunday.  Associated left jaw pain. States that she feels some dysequilibrium, but no vertigo or dizziness. Denies neck stiffness, headache, rash, or photphobia. Denies recent URI, sinus pressure, or pain. Denies eye pain, discharge or signs of infection. Denies fevers, chills, myalgias, arthralgias, nausea, vomiting, diarrhea.    Patient is a 61 y.o. female presenting with ear pain. No language interpreter was used.  Otalgia This is a new problem. The current episode started more than 2 days ago. There is pain in the left ear. The problem occurs constantly. The problem has been gradually worsening. There has been no fever. The pain is at a severity of 7/10. The pain is moderate. Associated symptoms include ear discharge. Pertinent negatives include no headaches, no hearing loss, no rhinorrhea, no sore throat, no abdominal pain, no diarrhea, no vomiting, no neck pain, no cough and no rash. Her past medical history does not include chronic ear infection, hearing loss or tympanostomy tube.    Past Medical History  Diagnosis Date  . Hypertension   . Diabetes mellitus   . Gout   . Thyroid disease   . Hyperlipemia     Past Surgical History  Procedure Date  . Gallbladder surgery   . Vesicovaginal fistula closure w/ tah     Family History  Problem Relation Age of Onset  . Asthma Father   . Allergies Father   . Allergies Brother     History    Substance Use Topics  . Smoking status: Former Smoker -- 0.3 packs/day for 15 years    Types: Cigarettes    Quit date: 04/01/2011  . Smokeless tobacco: Never Used  . Alcohol Use: No    OB History    Grav Para Term Preterm Abortions TAB SAB Ect Mult Living                  Review of Systems  Constitutional: Negative for fever, chills and fatigue.  HENT: Positive for ear pain and ear discharge. Negative for hearing loss, congestion, sore throat, rhinorrhea, sneezing, neck pain, neck stiffness, postnasal drip, sinus pressure and tinnitus.   Eyes: Negative for photophobia, pain, discharge, redness and visual disturbance.  Respiratory: Negative for cough and shortness of breath.   Cardiovascular: Negative for chest pain.  Gastrointestinal: Negative for nausea, vomiting, abdominal pain, diarrhea and constipation.  Musculoskeletal: Negative for myalgias and arthralgias.  Skin: Negative for rash.  Neurological: Positive for light-headedness. Negative for dizziness and headaches.    Allergies  Tetracyclines & related  Home Medications   Current Outpatient Rx  Name Route Sig Dispense Refill  . ALBUTEROL SULFATE HFA 108 (90 BASE) MCG/ACT IN AERS Inhalation Inhale 2 puffs into the lungs every 6 (six) hours as needed.    Marland Kitchen CITALOPRAM HYDROBROMIDE 40 MG PO TABS Oral Take 40 mg by mouth daily.    . COLCHICINE 0.6 MG PO TABS Oral Take 0.6 mg by mouth 2 (two) times  daily as needed. Gout    . CYCLOBENZAPRINE HCL 10 MG PO TABS Oral Take 10 mg by mouth at bedtime as needed. For muscle pain    . DICYCLOMINE HCL 20 MG PO TABS Oral Take 20 mg by mouth 2 (two) times daily.    Marland Kitchen GLIMEPIRIDE 4 MG PO TABS Oral Take 4 mg by mouth 2 (two) times daily.    Marland Kitchen HYDROCORTISONE 2.5 % EX CREA Topical Apply 1 application topically 2 (two) times daily as needed. Pruritis    . LEVOTHYROXINE SODIUM 50 MCG PO TABS Oral Take 50 mcg by mouth daily.    Marland Kitchen LORAZEPAM 1 MG PO TABS Oral Take 1 mg by mouth daily.    Marland Kitchen  METFORMIN HCL 500 MG PO TABS Oral Take 500 mg by mouth 2 (two) times daily with a meal.    . NEBIVOLOL HCL 20 MG PO TABS Oral Take 20 mg by mouth daily. One daily    . OMEPRAZOLE 20 MG PO CPDR Oral Take 20 mg by mouth 2 (two) times daily.    Marland Kitchen PRESCRIPTION MEDICATION Topical Apply 1 application topically daily. Compound : tac 0.1% cream +cetaphil lotion 1:2    . PRESCRIPTION MEDICATION Topical Apply 1 application topically daily. Compound medication: HC cream2.5%/ eucerin 1:1    . SIMVASTATIN 40 MG PO TABS Oral Take 40 mg by mouth daily.     . TRAMADOL HCL 50 MG PO TABS Oral Take 50 mg by mouth every 6 (six) hours as needed. For pain    . VALSARTAN 80 MG PO TABS Oral Take 80 mg by mouth daily.    . NEOMYCIN-POLYMYXIN-HC 3.5-10000-1 OT SUSP Left Ear Place 4 drops into the left ear 3 (three) times daily. 10 mL 0  . OXYCODONE-ACETAMINOPHEN 5-325 MG PO TABS Oral Take 2 tablets by mouth every 4 (four) hours as needed for pain. 6 tablet 0    BP 131/93  Pulse 88  Temp 98.6 F (37 C) (Oral)  Resp 18  SpO2 95%  Physical Exam  Vitals reviewed. Constitutional: She is oriented to person, place, and time. She appears well-developed and well-nourished. No distress.  HENT:  Head: Normocephalic and atraumatic.  Right Ear: Hearing, tympanic membrane, external ear and ear canal normal.  Left Ear: Hearing and tympanic membrane normal. There is swelling and tenderness. No foreign bodies. No mastoid tenderness.       TTP of tragus of left ear.  No mastoid tenderness.  No evidence of discharge.  The external canal is swollen and erythematous.  Tm is visualized and normal in appearance.  Eyes: Conjunctivae normal are normal. No scleral icterus.  Neck: Normal range of motion.  Cardiovascular: Normal rate, regular rhythm and normal heart sounds.  Exam reveals no gallop and no friction rub.   No murmur heard. Pulmonary/Chest: Effort normal and breath sounds normal. No respiratory distress.  Abdominal: Soft.  Bowel sounds are normal. She exhibits no distension and no mass. There is no tenderness. There is no guarding.  Neurological: She is alert and oriented to person, place, and time.  Skin: Skin is warm and dry. She is not diaphoretic.    ED Course  Procedures (including critical care time)  Labs Reviewed - No data to display No results found.   1. Otitis externa       MDM  Paitent with otitis externa.  I am providing here with abx drops and pain relief.  She may return to ED if sxs do not resolve/. Discussed  reasons to seek immediate care. Patient expresses understanding and agrees with plan.         Arthor Captain, PA-C 05/23/12 [phone]

## 2012-05-25 NOTE — ED Provider Notes (Signed)
Medical screening examination/treatment/procedure(s) were performed by non-physician practitioner and as supervising physician I was immediately available for consultation/collaboration.  Raeford Razor, MD 05/25/12 [phone]

## 2012-06-10 ENCOUNTER — Ambulatory Visit (HOSPITAL_COMMUNITY)
Admission: RE | Admit: 2012-06-10 | Discharge: 2012-06-10 | Disposition: A | Discharge status: Home / Self Care | Payer: Medicaid Other | Source: Ambulatory Visit | Attending: Internal Medicine | Admitting: Internal Medicine

## 2012-06-10 ENCOUNTER — Ambulatory Visit (INDEPENDENT_AMBULATORY_CARE_PROVIDER_SITE_OTHER): Payer: Medicaid Other | Admitting: Internal Medicine

## 2012-06-10 ENCOUNTER — Encounter: Payer: Self-pay | Admitting: Internal Medicine

## 2012-06-10 ENCOUNTER — Ambulatory Visit (INDEPENDENT_AMBULATORY_CARE_PROVIDER_SITE_OTHER)
Admission: RE | Admit: 2012-06-10 | Discharge: 2012-06-10 | Disposition: A | Discharge status: Home / Self Care | Payer: Medicaid Other | Source: Ambulatory Visit | Attending: Internal Medicine | Admitting: Internal Medicine

## 2012-06-10 VITALS — BP 130/86 | HR 79 | Temp 97.3°F | Ht 64.0 in | Wt 180.0 lb

## 2012-06-10 DIAGNOSIS — R0600 Dyspnea, unspecified: Secondary | ICD-10-CM

## 2012-06-10 DIAGNOSIS — R0989 Other specified symptoms and signs involving the circulatory and respiratory systems: Secondary | ICD-10-CM | POA: Insufficient documentation

## 2012-06-10 DIAGNOSIS — R062 Wheezing: Secondary | ICD-10-CM | POA: Insufficient documentation

## 2012-06-10 DIAGNOSIS — R0609 Other forms of dyspnea: Secondary | ICD-10-CM | POA: Insufficient documentation

## 2012-06-10 DIAGNOSIS — J988 Other specified respiratory disorders: Secondary | ICD-10-CM | POA: Insufficient documentation

## 2012-06-10 DIAGNOSIS — R05 Cough: Secondary | ICD-10-CM | POA: Insufficient documentation

## 2012-06-10 DIAGNOSIS — I10 Essential (primary) hypertension: Secondary | ICD-10-CM

## 2012-06-10 DIAGNOSIS — Z87891 Personal history of nicotine dependence: Secondary | ICD-10-CM | POA: Insufficient documentation

## 2012-06-10 DIAGNOSIS — R059 Cough, unspecified: Secondary | ICD-10-CM | POA: Insufficient documentation

## 2012-06-10 LAB — PULMONARY FUNCTION TEST

## 2012-06-10 MED ORDER — FLUTICASONE-SALMETEROL 250-50 MCG/DOSE IN AEPB: 1.0000 | INHALATION_SPRAY | Freq: Two times a day (BID) | RESPIRATORY_TRACT | Status: DC

## 2012-06-10 MED ORDER — ALBUTEROL SULFATE (5 MG/ML) 0.5% IN NEBU
2.5000 mg | INHALATION_SOLUTION | Freq: Once | RESPIRATORY_TRACT | Status: AC
  Administered 2012-06-10: 2.5 mg via RESPIRATORY_TRACT

## 2012-06-10 NOTE — Progress Notes (Signed)
Subjective:    Patient ID: Alejandra Duffy, female    DOB: [birth year]   MRN: [id number]  HPI  61 yobf never regular smoker with h/o sinus infections x decades then " turned into asthma" early 2000s mostly rx with abx and neb daily then worse symptoms of cough around May 2013 so referred 04/04/2012 to pulmonary clinic by  Margaretmary Bayley PA/ Dr Melven Sartorius at [facility]   04/04/2012 1st pulmonary eval on acei cc difficult to control cough x 3 month 24 h per day but worse daytime when get in car and when lie down at night > min prod slt beige mucus rx zpack and tessilon not effective so changed to liquid cough medication that worked until she ran out of it.  No better now with neb albuterol whereas previously helped quite a bit.  No flare of "sinus infection" assoc with the cough but is much more hoarse now. rec Stop lisinopril and replace it with diovan (valsartan) 80 mg one daily (we will phone it in) Change omeprazole 20 mg Take 30- 60 min before your first and last meals of the day  GERD  diet. If still coughing ok to use tramadol to control it but it should it go away after a week or two   05/04/2012 f/u ov/Wert cc cough a lot better not using tramadol x for pain. Doe x cleaning x years, more in fall, some better p inhaler despite coreg x years. Not sure what came first,the sob or the coreg.  rec Stop coreg Start bystolic 20 mg one daily Take delsym two tsp every 12 hours and supplement if needed with  tramadol 50 mg up to 2 every 4 hours to suppress the urge to cough.     06/10/2012 f/u ov/Wert cc cough resolved to her satisfaction but still sob with activity more than slow adls and having to use inhalers for "wheeezing" with activity.  No obvious daytime variabilty or assoc   cp or chest tightness,  overt sinus or hb symptoms. No unusual exp hx   Sleeping ok without nocturnal  or early am exacerbation  of respiratory  c/o's or need for noct saba. Also denies any obvious fluctuation of  symptoms with weather or environmental changes or other aggravating or alleviating factors except as outlined above   ROS  The following are not active complaints unless bolded sore throat, dysphagia, dental problems, itching, sneezing,  nasal congestion or excess/ purulent secretions, ear ache,   fever, chills, sweats, unintended wt loss, pleuritic or exertional cp, hemoptysis,  orthopnea pnd or leg swelling, presyncope, palpitations, heartburn, abdominal pain, anorexia, nausea, vomiting, diarrhea  or change in bowel or urinary habits, change in stools or urine, dysuria,hematuria,  rash, arthralgias, visual complaints, headache, numbness weakness or ataxia or problems with walking or coordination,  change in mood/affect or memory.        .       Objective:   Physical Exam   wt 06/10/2012  180  Wt Readings from Last 3 Encounters:  04/04/12 197 lb 6.4 oz (89.54 kg)  11/27/11 180 lb (81.647 kg)    Min hoarse bf no longer with  voice fatigue   HEENT: nl dentition, turbinates, and orophanx. Nl external ear canals without cough reflex   NECK :  without JVD/Nodes/TM/ nl carotid upstrokes bilaterally   LUNGS: no acc muscle use, clear to A and P bilaterally without cough on insp or exp maneuvers   CV:  RRR  no s3 or murmur or increase in P2, no edema   ABD:  soft and nontender with nl excursion in the supine position. No bruits or organomegaly, bowel sounds nl  MS:  warm without deformities, calf tenderness, cyanosis or clubbing     cxr 06/10/12 No active cardiopulmonary disease. No change    05/04/12 Labs All  nl including hc03 tsh and bnp, hgb       Assessment & Plan:

## 2012-06-10 NOTE — Patient Instructions (Addendum)
Continue  bystolic 10 mg one daily    Advair 250/50 twice daily   Only use your albuterol (nebulizer) as a rescue medication to be used if you can't catch your breath by resting or doing a relaxed purse lip breathing pattern. The less you use it, the better it will work when you need it.     Please schedule a follow up office visit in 6 weeks, sooner if needed

## 2012-06-11 NOTE — Assessment & Plan Note (Signed)
D/C ACEI 04/04/2012 due to cough D/C Coreg 05/04/12 due to sob / ? Asthmatic  Adequate control on present rx, reviewed need to leave off coreg for now until sort this out, samples of bystolic given.

## 2012-06-13 NOTE — Progress Notes (Signed)
Quick Note:  Spoke with pt and notified of results per Dr. Wert. Pt verbalized understanding and denied any questions.  ______ 

## 2012-07-08 ENCOUNTER — Ambulatory Visit (INDEPENDENT_AMBULATORY_CARE_PROVIDER_SITE_OTHER): Payer: Medicaid Other | Admitting: Internal Medicine

## 2012-07-08 ENCOUNTER — Encounter: Payer: Self-pay | Admitting: Internal Medicine

## 2012-07-08 VITALS — BP 126/84 | HR 97 | Temp 98.2°F | Ht 64.0 in | Wt 205.0 lb

## 2012-07-08 DIAGNOSIS — I10 Essential (primary) hypertension: Secondary | ICD-10-CM

## 2012-07-08 DIAGNOSIS — R05 Cough: Secondary | ICD-10-CM

## 2012-07-08 DIAGNOSIS — R0609 Other forms of dyspnea: Secondary | ICD-10-CM

## 2012-07-08 DIAGNOSIS — R0600 Dyspnea, unspecified: Secondary | ICD-10-CM

## 2012-07-08 MED ORDER — PREDNISONE (PAK) 10 MG PO TABS: ORAL_TABLET | ORAL | Status: DC

## 2012-07-08 NOTE — Progress Notes (Signed)
Subjective:    Patient ID: Alejandra Duffy, female    DOB: August 26, 1951   MRN: [id number]  HPI  57 yobf never regular smoker with h/o sinus infections x decades then " turned into asthma" early 2000s mostly rx with abx and neb daily then worse symptoms of cough around May 2013 so referred 04/04/2012 to pulmonary clinic by  Margaretmary Bayley PA/ Dr Melven Sartorius at [facility]   04/04/2012 1st pulmonary eval on acei cc difficult to control cough x 3 month 24 h per day but worse daytime when get in car and when lie down at night > min prod slt beige mucus rx zpack and tessilon not effective so changed to liquid cough medication that worked until she ran out of it.  No better now with neb albuterol whereas previously helped quite a bit.  No flare of "sinus infection" assoc with the cough but is much more hoarse now. rec Stop lisinopril and replace it with diovan (valsartan) 80 mg one daily (we will phone it in) Change omeprazole 20 mg Take 30- 60 min before your first and last meals of the day  GERD  diet. If still coughing ok to use tramadol to control it but it should it go away after a week or two   05/04/2012 f/u ov/Wert cc cough a lot better not using tramadol x for pain. Doe x cleaning x years, more in fall, some better p inhaler despite coreg x years. Not sure what came first,the sob or the coreg.  rec Stop coreg Start bystolic 20 mg one daily Take delsym two tsp every 12 hours and supplement if needed with  tramadol 50 mg up to 2 every 4 hours to suppress the urge to cough.     06/10/2012 f/u ov/Wert cc cough resolved to her satisfaction but still sob with activity more than slow adls and having to use inhalers for "wheeezing" with activity. rec Continue  bystolic 10 mg one daily   Advair 250/50 twice daily  Only use your albuterol (nebulizer) as a rescue medication   07/08/2012 f/u ov/Wert cc worse cough and sob x one week, dry cough and hoarsness on advair 250 bid and has not tried  saba.  No obvious daytime variabilty or excess mucuscp or chest tightness, subjective wheeze overt sinus or hb symptoms. No unusual exp hx or     Sleeping ok without nocturnal  or early am exacerbation  of respiratory  c/o's or need for noct saba. Also denies any obvious fluctuation of symptoms with weather or environmental changes or other aggravating or alleviating factors except as outlined above   ROS  The following are not active complaints unless bolded sore throat, dysphagia, dental problems, itching, sneezing,  nasal congestion or excess/ purulent secretions, ear ache,   fever, chills, sweats, unintended wt loss, pleuritic or exertional cp, hemoptysis,  orthopnea pnd or leg swelling, presyncope, palpitations, heartburn, abdominal pain, anorexia, nausea, vomiting, diarrhea  or change in bowel or urinary habits, change in stools or urine, dysuria,hematuria,  rash, arthralgias, visual complaints, headache, numbness weakness or ataxia or problems with walking or coordination,  change in mood/affect or memory.        .       Objective:   Physical Exam   wt 06/10/2012  180 > 07/08/2012  205 Wt Readings from Last 3 Encounters:  04/04/12 197 lb 6.4 oz (89.54 kg)  11/27/11 180 lb (81.647 kg)    Severely  hoarse bf  nad  prominent pseudowheeze  HEENT: nl dentition, turbinates, and orophanx. Nl external ear canals without cough reflex   NECK :  without JVD/Nodes/TM/ nl carotid upstrokes bilaterally   LUNGS: no acc muscle use, clear to A and P bilaterally without cough on insp or exp maneuvers   CV:  RRR  no s3 or murmur or increase in P2, no edema   ABD:  soft and nontender with nl excursion in the supine position. No bruits or organomegaly, bowel sounds nl  MS:  warm without deformities, calf tenderness, cyanosis or clubbing     cxr 06/10/12 No active cardiopulmonary disease. No change    05/04/12 Labs All  nl including hc03 tsh and bnp, hgb       Assessment & Plan:

## 2012-07-08 NOTE — Assessment & Plan Note (Signed)
-   06/10/2012  Walked RA x 3 laps @ 185 ft each stopped due to end of study no desat  - PFT's 06/10/2012 FEV1  1.75 (87%) ratio 81 and 9% better dlco 48 corrects to 70% - Worse on advair 07/08/2012 > d/c

## 2012-07-08 NOTE — Patient Instructions (Signed)
Stop advair  Prednisone 10 mg take  4 each am x 2 days,   2 each am x 2 days,  1 each am x2days and stop   Prilosec should be taken Take 30- 60 min before your first and last meals of the day   For cough or to use tramadol  For short of breath ok to use proaire  If not better to your satisfaction, the next step is  See Tammy NP   with all your medications, even over the counter meds, separated in two separate bags, the ones you take no matter what vs the ones you stop once you feel better and take only as needed when you feel you need them.   Tammy  will generate for you a new user friendly medication calendar that will put [place] all on the same page re: your medication use.     Without this process, it simply isn't possible to assure that we are providing  your outpatient care  with  the attention to detail we feel you deserve.   If we cannot assure that you're getting that kind of care,  then we cannot manage your problem effectively from this clinic.  Once you have seen Tammy and we are sure that we're all on the same page with your medication use she will arrange follow up with me.

## 2012-07-08 NOTE — Assessment & Plan Note (Addendum)
-   Trial off ACEI  04/04/2012 >>> cough improved then worse on advair 07/08/2012  (d/c'd)  In retrospect this is  Classic Upper airway cough syndrome, so named because it's frequently impossible to sort out how much is  CR/sinusitis with freq throat clearing (which can be related to primary GERD)   vs  causing  secondary (" extra esophageal")  GERD from wide swings in gastric pressure that occur with throat clearing, often  promoting self use of mint and menthol lozenges that reduce the lower esophageal sphincter tone and exacerbate the problem further in a cyclical fashion.   These are the same pts (now being labeled as having "irritable larynx syndrome" by some cough centers) who not infrequently have a history of having failed to tolerate ace inhibitors,  dry powder inhalers or biphosphonates or report having atypical reflux symptoms that don't respond to standard doses of PPI , and are easily confused as having aecopd or asthma flares by even experienced allergists/ pulmonologists.   So stop advair, rx prn proaire and max rx for gerd then regroup with all meds if not satisfied

## 2012-07-08 NOTE — Assessment & Plan Note (Signed)
D/C ACEI 04/04/2012 due to cough D/C Coreg 05/04/12 due to sob / ? Asthmatic  Not sure she has asthma at all at this point so probably ok to rechallenge at some point with coreg

## 2012-08-08 ENCOUNTER — Other Ambulatory Visit: Payer: Self-pay | Admitting: Internal Medicine

## 2012-09-06 ENCOUNTER — Other Ambulatory Visit: Payer: Self-pay | Admitting: Internal Medicine

## 2012-10-27 ENCOUNTER — Other Ambulatory Visit: Payer: Self-pay | Admitting: Internal Medicine

## 2013-01-17 ENCOUNTER — Other Ambulatory Visit: Payer: Self-pay

## 2013-01-17 DIAGNOSIS — Z1231 Encounter for screening mammogram for malignant neoplasm of breast: Secondary | ICD-10-CM

## 2013-02-22 ENCOUNTER — Other Ambulatory Visit: Payer: Self-pay | Admitting: Internal Medicine

## 2013-03-09 ENCOUNTER — Ambulatory Visit
Admission: RE | Admit: 2013-03-09 | Discharge: 2013-03-09 | Disposition: A | Discharge status: Home / Self Care | Payer: Medicaid Other | Source: Ambulatory Visit

## 2013-03-09 DIAGNOSIS — Z1231 Encounter for screening mammogram for malignant neoplasm of breast: Secondary | ICD-10-CM

## 2013-03-17 ENCOUNTER — Other Ambulatory Visit: Payer: Self-pay | Admitting: Internal Medicine

## 2013-03-31 ENCOUNTER — Other Ambulatory Visit: Payer: Self-pay | Admitting: Gastroenterology

## 2013-03-31 DIAGNOSIS — R109 Unspecified abdominal pain: Secondary | ICD-10-CM

## 2013-04-05 ENCOUNTER — Ambulatory Visit
Admission: RE | Admit: 2013-04-05 | Discharge: 2013-04-05 | Disposition: A | Discharge status: Home / Self Care | Payer: Medicaid Other | Source: Ambulatory Visit | Attending: Gastroenterology | Admitting: Gastroenterology

## 2013-04-05 DIAGNOSIS — R109 Unspecified abdominal pain: Secondary | ICD-10-CM

## 2014-02-16 ENCOUNTER — Other Ambulatory Visit: Payer: Self-pay | Admitting: Cardiology

## 2014-02-16 DIAGNOSIS — Z1231 Encounter for screening mammogram for malignant neoplasm of breast: Secondary | ICD-10-CM

## 2014-03-15 ENCOUNTER — Ambulatory Visit
Admission: RE | Admit: 2014-03-15 | Discharge: 2014-03-15 | Disposition: A | Discharge status: Home / Self Care | Payer: Medicaid Other | Source: Ambulatory Visit | Attending: Cardiology | Admitting: Cardiology

## 2014-03-15 DIAGNOSIS — Z1231 Encounter for screening mammogram for malignant neoplasm of breast: Secondary | ICD-10-CM

## 2015-03-19 ENCOUNTER — Other Ambulatory Visit: Payer: Self-pay

## 2015-03-19 DIAGNOSIS — Z1231 Encounter for screening mammogram for malignant neoplasm of breast: Secondary | ICD-10-CM

## 2015-04-11 ENCOUNTER — Ambulatory Visit
Admission: RE | Admit: 2015-04-11 | Discharge: 2015-04-11 | Disposition: A | Discharge status: Home / Self Care | Payer: Medicare HMO | Source: Ambulatory Visit

## 2015-04-11 DIAGNOSIS — Z1231 Encounter for screening mammogram for malignant neoplasm of breast: Secondary | ICD-10-CM

## 2015-09-30 ENCOUNTER — Other Ambulatory Visit: Payer: Self-pay | Admitting: Cardiology

## 2015-09-30 DIAGNOSIS — R079 Chest pain, unspecified: Secondary | ICD-10-CM

## 2015-10-02 ENCOUNTER — Encounter (HOSPITAL_COMMUNITY): Payer: Medicare HMO

## 2015-10-07 ENCOUNTER — Encounter (HOSPITAL_COMMUNITY)
Admission: RE | Admit: 2015-10-07 | Discharge: 2015-10-07 | Disposition: A | Discharge status: Home / Self Care | Payer: Medicare HMO | Source: Ambulatory Visit | Attending: Cardiology | Admitting: Cardiology

## 2015-10-07 ENCOUNTER — Encounter (HOSPITAL_COMMUNITY): Payer: Medicare HMO

## 2015-10-07 DIAGNOSIS — R079 Chest pain, unspecified: Secondary | ICD-10-CM | POA: Diagnosis present

## 2015-10-07 MED ORDER — REGADENOSON 0.4 MG/5ML IV SOLN
INTRAVENOUS | Status: AC
  Administered 2015-10-07: 0.4 mg
  Filled 2015-10-07: qty 5

## 2015-10-07 MED ORDER — TECHNETIUM TC 99M SESTAMIBI GENERIC - CARDIOLITE
30.0000 | Freq: Once | INTRAVENOUS | Status: AC | PRN
  Administered 2015-10-07: 30 via INTRAVENOUS

## 2015-10-07 MED ORDER — TECHNETIUM TC 99M SESTAMIBI GENERIC - CARDIOLITE
10.0000 | Freq: Once | INTRAVENOUS | Status: AC | PRN
  Administered 2015-10-07: 10 via INTRAVENOUS

## 2015-10-07 MED ORDER — REGADENOSON 0.4 MG/5ML IV SOLN: 0.4000 mg | Freq: Once | INTRAVENOUS | Status: DC

## 2015-10-08 LAB — NM MYOCAR MULTI W/SPECT W/WALL MOTION / EF
CHL CUP STRESS STAGE 1 GRADE: 0 %
CHL CUP STRESS STAGE 1 SPEED: 0 mph
CHL CUP STRESS STAGE 2 HR: 68 {beats}/min
CHL CUP STRESS STAGE 2 SPEED: 0 mph
CHL CUP STRESS STAGE 3 DBP: 78 mmHg
CHL CUP STRESS STAGE 3 HR: 81 {beats}/min
CHL CUP STRESS STAGE 3 SBP: 126 mmHg
CHL CUP STRESS STAGE 3 SPEED: 0 mph
CHL CUP STRESS STAGE 4 DBP: 80 mmHg
CHL CUP STRESS STAGE 4 HR: 78 {beats}/min
CHL CUP STRESS STAGE 5 SPEED: 0 mph
CSEPPHR: 75 {beats}/min
CSEPPMHR: 48 %
Estimated workload: 1 METS
Stage 1 DBP: 78 mmHg
Stage 1 HR: 68 {beats}/min
Stage 1 SBP: 121 mmHg
Stage 2 Grade: 0 %
Stage 3 Grade: 0 %
Stage 4 Grade: 0 %
Stage 4 SBP: 124 mmHg
Stage 4 Speed: 0 mph
Stage 5 Grade: 0 %
Stage 5 HR: 75 {beats}/min

## 2016-03-17 ENCOUNTER — Other Ambulatory Visit: Payer: Self-pay | Admitting: Cardiology

## 2016-03-17 DIAGNOSIS — Z139 Encounter for screening, unspecified: Secondary | ICD-10-CM

## 2016-04-21 ENCOUNTER — Ambulatory Visit
Admission: RE | Admit: 2016-04-21 | Discharge: 2016-04-21 | Disposition: A | Discharge status: Home / Self Care | Payer: Medicare HMO | Source: Ambulatory Visit | Attending: Cardiology | Admitting: Cardiology

## 2016-04-21 DIAGNOSIS — Z139 Encounter for screening, unspecified: Secondary | ICD-10-CM

## 2017-03-05 ENCOUNTER — Other Ambulatory Visit: Payer: Self-pay | Admitting: Cardiology

## 2017-03-05 DIAGNOSIS — Z1231 Encounter for screening mammogram for malignant neoplasm of breast: Secondary | ICD-10-CM

## 2017-03-19 ENCOUNTER — Other Ambulatory Visit: Payer: Self-pay | Admitting: Cardiology

## 2017-03-19 DIAGNOSIS — R079 Chest pain, unspecified: Secondary | ICD-10-CM

## 2017-03-31 ENCOUNTER — Encounter (HOSPITAL_COMMUNITY): Payer: Medicare HMO

## 2017-04-07 ENCOUNTER — Encounter (HOSPITAL_COMMUNITY): Payer: Self-pay

## 2017-04-07 ENCOUNTER — Ambulatory Visit (HOSPITAL_COMMUNITY)
Admission: RE | Admit: 2017-04-07 | Discharge: 2017-04-07 | Disposition: A | Discharge status: Home / Self Care | Payer: Medicare HMO | Source: Ambulatory Visit | Attending: Cardiology | Admitting: Cardiology

## 2017-04-07 DIAGNOSIS — R931 Abnormal findings on diagnostic imaging of heart and coronary circulation: Secondary | ICD-10-CM | POA: Diagnosis not present

## 2017-04-07 DIAGNOSIS — R079 Chest pain, unspecified: Secondary | ICD-10-CM | POA: Diagnosis present

## 2017-04-07 MED ORDER — TECHNETIUM TC 99M TETROFOSMIN IV KIT
29.7000 | PACK | Freq: Once | INTRAVENOUS | Status: AC | PRN
  Administered 2017-04-07: 29.7 via INTRAVENOUS

## 2017-04-07 MED ORDER — REGADENOSON 0.4 MG/5ML IV SOLN
INTRAVENOUS | Status: AC
  Filled 2017-04-07: qty 5

## 2017-04-07 MED ORDER — TECHNETIUM TC 99M TETROFOSMIN IV KIT
11.0000 | PACK | Freq: Once | INTRAVENOUS | Status: AC | PRN
  Administered 2017-04-07: 11 via INTRAVENOUS

## 2017-04-07 MED ORDER — REGADENOSON 0.4 MG/5ML IV SOLN
0.4000 mg | Freq: Once | INTRAVENOUS | Status: AC
  Administered 2017-04-07: 0.4 mg via INTRAVENOUS

## 2017-04-20 ENCOUNTER — Encounter (HOSPITAL_COMMUNITY): Payer: Self-pay | Admitting: Cardiology

## 2017-04-20 ENCOUNTER — Ambulatory Visit (HOSPITAL_COMMUNITY)
Admission: RE | Admit: 2017-04-20 | Discharge: 2017-04-20 | Disposition: A | Discharge status: Home / Self Care | Payer: Medicare HMO | Source: Ambulatory Visit | Attending: Cardiology | Admitting: Cardiology

## 2017-04-20 ENCOUNTER — Ambulatory Visit (HOSPITAL_COMMUNITY)
Admission: RE | Disposition: A | Discharge status: Home / Self Care | Payer: Self-pay | Source: Ambulatory Visit | Attending: Cardiology

## 2017-04-20 DIAGNOSIS — Q250 Patent ductus arteriosus: Secondary | ICD-10-CM | POA: Diagnosis not present

## 2017-04-20 DIAGNOSIS — I2510 Atherosclerotic heart disease of native coronary artery without angina pectoris: Secondary | ICD-10-CM | POA: Insufficient documentation

## 2017-04-20 DIAGNOSIS — R9439 Abnormal result of other cardiovascular function study: Secondary | ICD-10-CM | POA: Diagnosis present

## 2017-04-20 HISTORY — PX: LEFT HEART CATH AND CORONARY ANGIOGRAPHY: CATH118249

## 2017-04-20 LAB — GLUCOSE, CAPILLARY
Glucose-Capillary: 129 mg/dL — ABNORMAL HIGH (ref 65–99)
Glucose-Capillary: 117 mg/dL — ABNORMAL HIGH (ref 65–99)

## 2017-04-20 SURGERY — LEFT HEART CATH AND CORONARY ANGIOGRAPHY
Anesthesia: LOCAL

## 2017-04-20 MED ORDER — SODIUM CHLORIDE 0.9 % IV SOLN: INTRAVENOUS | Status: AC

## 2017-04-20 MED ORDER — FENTANYL CITRATE (PF) 100 MCG/2ML IJ SOLN
INTRAMUSCULAR | Status: DC | PRN
  Administered 2017-04-20: 25 ug via INTRAVENOUS

## 2017-04-20 MED ORDER — SODIUM CHLORIDE 0.9 % IV SOLN
250.0000 mL | INTRAVENOUS | Status: DC | PRN
Start: 2017-04-20 — End: 2017-04-20

## 2017-04-20 MED ORDER — IOPAMIDOL (ISOVUE-370) INJECTION 76%
INTRAVENOUS | Status: AC
Start: 2017-04-20 — End: ?
  Filled 2017-04-20: qty 100

## 2017-04-20 MED ORDER — FENTANYL CITRATE (PF) 100 MCG/2ML IJ SOLN
INTRAMUSCULAR | Status: AC
  Filled 2017-04-20: qty 2

## 2017-04-20 MED ORDER — ONDANSETRON HCL 4 MG/2ML IJ SOLN: 4.0000 mg | Freq: Four times a day (QID) | INTRAMUSCULAR | Status: DC | PRN

## 2017-04-20 MED ORDER — LIDOCAINE HCL (PF) 1 % IJ SOLN
INTRAMUSCULAR | Status: DC | PRN
  Administered 2017-04-20: 18 mL via INTRADERMAL

## 2017-04-20 MED ORDER — IOPAMIDOL (ISOVUE-370) INJECTION 76%
INTRAVENOUS | Status: DC | PRN
  Administered 2017-04-20: 60 mL via INTRA_ARTERIAL

## 2017-04-20 MED ORDER — SODIUM CHLORIDE 0.9% FLUSH: 3.0000 mL | Freq: Two times a day (BID) | INTRAVENOUS | Status: DC

## 2017-04-20 MED ORDER — LIDOCAINE HCL 1 % IJ SOLN
INTRAMUSCULAR | Status: AC
  Filled 2017-04-20: qty 20

## 2017-04-20 MED ORDER — HEPARIN (PORCINE) IN NACL 2-0.9 UNIT/ML-% IJ SOLN
INTRAMUSCULAR | Status: AC | PRN
  Administered 2017-04-20: 1000 mL via INTRA_ARTERIAL

## 2017-04-20 MED ORDER — SODIUM CHLORIDE 0.9 % WEIGHT BASED INFUSION
3.0000 mL/kg/h | INTRAVENOUS | Status: DC
  Administered 2017-04-20: 3 mL/kg/h via INTRAVENOUS

## 2017-04-20 MED ORDER — SODIUM CHLORIDE 0.9% FLUSH: 3.0000 mL | INTRAVENOUS | Status: DC | PRN

## 2017-04-20 MED ORDER — MIDAZOLAM HCL 2 MG/2ML IJ SOLN
INTRAMUSCULAR | Status: DC | PRN
Start: 2017-04-20 — End: 2017-04-20
  Administered 2017-04-20: 1 mg via INTRAVENOUS

## 2017-04-20 MED ORDER — MIDAZOLAM HCL 2 MG/2ML IJ SOLN
INTRAMUSCULAR | Status: AC
  Filled 2017-04-20: qty 2

## 2017-04-20 MED ORDER — SODIUM CHLORIDE 0.9 % WEIGHT BASED INFUSION: 1.0000 mL/kg/h | INTRAVENOUS | Status: DC

## 2017-04-20 MED ORDER — SODIUM CHLORIDE 0.9 % IV SOLN: 250.0000 mL | INTRAVENOUS | Status: DC | PRN

## 2017-04-20 MED ORDER — ASPIRIN 81 MG PO CHEW
CHEWABLE_TABLET | ORAL | Status: AC
  Filled 2017-04-20: qty 1

## 2017-04-20 MED ORDER — CLOPIDOGREL BISULFATE 75 MG PO TABS
75.0000 mg | ORAL_TABLET | Freq: Every day | ORAL | Status: DC
  Administered 2017-04-20: 75 mg via ORAL

## 2017-04-20 MED ORDER — ACETAMINOPHEN 325 MG PO TABS: 650.0000 mg | ORAL_TABLET | ORAL | Status: DC | PRN

## 2017-04-20 MED ORDER — ASPIRIN 81 MG PO CHEW
81.0000 mg | CHEWABLE_TABLET | ORAL | Status: AC
  Administered 2017-04-20: 81 mg via ORAL

## 2017-04-20 MED ORDER — HEPARIN (PORCINE) IN NACL 2-0.9 UNIT/ML-% IJ SOLN
INTRAMUSCULAR | Status: AC
  Filled 2017-04-20: qty 1000

## 2017-04-20 MED ORDER — CLOPIDOGREL BISULFATE 75 MG PO TABS
ORAL_TABLET | ORAL | Status: AC
  Filled 2017-04-20: qty 1

## 2017-04-20 SURGICAL SUPPLY — 7 items
CATH INFINITI 5FR MULTPACK ANG (CATHETERS) ×2 IMPLANT
KIT HEART LEFT (KITS) ×2 IMPLANT
PACK CARDIAC CATHETERIZATION (CUSTOM PROCEDURE TRAY) ×2 IMPLANT
SHEATH PINNACLE 5F 10CM (SHEATH) ×2 IMPLANT
SYR MEDRAD MARK V 150ML (SYRINGE) ×2 IMPLANT
TRANSDUCER W/STOPCOCK (MISCELLANEOUS) ×2 IMPLANT
WIRE EMERALD 3MM-J .035X150CM (WIRE) ×2 IMPLANT

## 2017-04-20 NOTE — H&P (Signed)
printed H&P in the chart needs to be scanned 

## 2017-04-20 NOTE — Discharge Instructions (Signed)

## 2017-04-20 NOTE — Progress Notes (Signed)
Site area: Right groin a 5 french arterial sheath was removed  Site Prior to Removal:  Level 0  Pressure Applied For 20 MINUTES    Bedrest Beginning at 0835am  Manual:   Yes.    Patient Status During Pull:  Stable  Post Pull Groin Site:  Level 0  Post Pull Instructions Given:  0  Post Pull Pulses Present:  Yes.    Dressing Applied:  Yes.    Comments:  VS remain stable

## 2017-04-20 NOTE — Interval H&P Note (Signed)
Cath Lab Visit (complete for each Cath Lab visit)  Clinical Evaluation Leading to the Procedure:   ACS: No.  Non-ACS:    Anginal Classification: CCS III  Anti-ischemic medical therapy: Maximal Therapy (2 or more classes of medications)  Non-Invasive Test Results: Intermediate-risk stress test findings: cardiac mortality 1-3%/year  Prior CABG: No previous CABG      History and Physical Interval Note:  04/20/2017 7:29 AM  Marcia Brash  has presented today for surgery, with the diagnosis of abnormal stress test  The various methods of treatment have been discussed with the patient and family. After consideration of risks, benefits and other options for treatment, the patient has consented to  Procedure(s): LEFT HEART CATH AND CORONARY ANGIOGRAPHY (N/A) as a surgical intervention .  The patient's history has been reviewed, patient examined, no change in status, stable for surgery.  I have reviewed the patient's chart and labs.  Questions were answered to the patient's satisfaction.     Rinaldo Cloud

## 2017-04-21 MED FILL — Lidocaine HCl Local Inj 1%: INTRAMUSCULAR | Qty: 20 | Status: AC

## 2017-05-25 ENCOUNTER — Ambulatory Visit
Admission: RE | Admit: 2017-05-25 | Discharge: 2017-05-25 | Disposition: A | Discharge status: Home / Self Care | Payer: Medicare HMO | Source: Ambulatory Visit | Attending: Cardiology | Admitting: Cardiology

## 2017-05-25 DIAGNOSIS — Z1231 Encounter for screening mammogram for malignant neoplasm of breast: Secondary | ICD-10-CM

## 2017-06-10 ENCOUNTER — Other Ambulatory Visit: Payer: Self-pay | Admitting: Orthopaedic Surgery

## 2017-06-10 DIAGNOSIS — S23420A Sprain of sternoclavicular (joint) (ligament), initial encounter: Secondary | ICD-10-CM

## 2017-06-16 ENCOUNTER — Ambulatory Visit
Admission: RE | Admit: 2017-06-16 | Discharge: 2017-06-16 | Disposition: A | Discharge status: Home / Self Care | Payer: Medicare HMO | Source: Ambulatory Visit | Attending: Orthopaedic Surgery | Admitting: Orthopaedic Surgery

## 2017-06-16 DIAGNOSIS — S23420A Sprain of sternoclavicular (joint) (ligament), initial encounter: Secondary | ICD-10-CM

## 2017-11-14 ENCOUNTER — Ambulatory Visit (HOSPITAL_COMMUNITY)
Admission: EM | Admit: 2017-11-14 | Discharge: 2017-11-14 | Disposition: A | Discharge status: Home / Self Care | Payer: Medicare HMO | Attending: Physician Assistant | Admitting: Physician Assistant

## 2017-11-14 ENCOUNTER — Encounter (HOSPITAL_COMMUNITY): Payer: Self-pay | Admitting: *Deleted

## 2017-11-14 DIAGNOSIS — K591 Functional diarrhea: Secondary | ICD-10-CM

## 2017-11-14 MED ORDER — LOPERAMIDE HCL 2 MG PO CAPS: 2.0000 mg | ORAL_CAPSULE | Freq: Four times a day (QID) | ORAL | 0 refills | Status: AC | PRN

## 2017-11-14 NOTE — ED Provider Notes (Signed)
11/14/2017 12:43 PM   DOB: 02/24/1951 / MRN: [id number]  SUBJECTIVE:  Alejandra Duffy is a 67 y.o. female presenting for nonbloody diarrhea times 6 days.  Patient just had a colonoscopy.  She has called the colonoscopy specialist however has not received a phone call back yet.  She denies any blood.  She denies nausea and emesis.  She denies focal abdominal pain, fever, dysuria.  She denies dizziness and last urination was about 2 hours ago.  She is trying to drink lots of fluids.  Per HPI  She is allergic to tetracyclines & related.   She  has a past medical history of Diabetes mellitus, Gout, Hyperlipemia, Hypertension, and Thyroid disease.    She  reports that she quit smoking about 6 years ago. Her smoking use included cigarettes. She has a 4.50 pack-year smoking history. she has never used smokeless tobacco. She reports that she does not drink alcohol or use drugs. She  has no sexual activity history on file. The patient  has a past surgical history that includes Gallbladder surgery; Vesicovaginal fistula closure w/ TAH; and LEFT HEART CATH AND CORONARY ANGIOGRAPHY (N/A, 04/20/2017).  Her family history includes Allergies in her brother and father; Asthma in her father.  ROS    OBJECTIVE:  BP 130/79 (BP Location: Left Arm)   Pulse 66   Temp 97.8 F (36.6 C) (Oral)   Resp 16   SpO2 96%   Physical Exam  Constitutional: She is active.  Non-toxic appearance.  Cardiovascular: Normal rate.  Pulmonary/Chest: Effort normal. No tachypnea.  Abdominal: Soft. Normal appearance and bowel sounds are normal. She exhibits no mass. There is no tenderness. There is no rigidity, no rebound, no guarding and no CVA tenderness.  Neurological: She is alert.  Skin: Skin is warm and dry. She is not diaphoretic. No pallor.    No results found for this or any previous visit (from the past 72 hour(s)).  No results found.  ASSESSMENT AND PLAN:  No orders of the defined types were placed in this  encounter.    Functional diarrhea: Most recent colonoscopy not available for review yet.  I am giving her Imodium to stop her diarrhea.  She is got a great exam.      The patient is advised to call or return to clinic if she does not see an improvement in symptoms, or to seek the care of the closest emergency department if she worsens with the above plan.   Deliah BostonMichael Clark, MHS, PA-C 11/14/2017 12:43 PM    Ofilia Neaslark, Michael L, PA-C 11/14/17 1244

## 2017-11-14 NOTE — Discharge Instructions (Signed)
I prescribed the Imodium.  You can take 2 tabs initially and then 1 tab after every loose stool.  Try not to take more than 4 tabs a day.

## 2017-11-14 NOTE — ED Triage Notes (Signed)
Patient reports having diarrhea and lower abdominal cramping since colonoscopy on Tuesday. No blood in diarrhea.

## 2018-02-18 ENCOUNTER — Encounter: Payer: Self-pay | Admitting: Physician Assistant

## 2018-05-09 ENCOUNTER — Other Ambulatory Visit: Payer: Self-pay | Admitting: Cardiology

## 2018-05-09 ENCOUNTER — Other Ambulatory Visit: Payer: Self-pay

## 2018-05-09 ENCOUNTER — Encounter (HOSPITAL_COMMUNITY): Payer: Self-pay | Admitting: *Deleted

## 2018-05-09 DIAGNOSIS — Z1231 Encounter for screening mammogram for malignant neoplasm of breast: Secondary | ICD-10-CM

## 2018-05-09 NOTE — Progress Notes (Addendum)
Alejandra Duffy denies chest pain or shortness of breath. Cardiologist is Dr Sharyn Lull, patient says she has an appointment in a month. PCP is Dr Lindaann Pascal.  Patient reports that fasting CBG's run around 112. I instructed patient to not take Metformin the morning of surgery. I instructed patient to check CBG after awaking and every 2 hours until arrival  to the hospital.  I Instructed patient if CBG is less than 70 to drink 1/2 cup of a clear juice. Recheck CBG in 15 minutes then call pre- op desk at [phone] for further instructions. I instructed patient to stop Meloxicam  for surgery.   I requested reports from Dr Sharyn Lull.

## 2018-05-10 ENCOUNTER — Ambulatory Visit (HOSPITAL_COMMUNITY): Payer: Medicare HMO | Admitting: Certified Registered"

## 2018-05-10 ENCOUNTER — Encounter (HOSPITAL_COMMUNITY)
Admission: RE | Disposition: A | Discharge status: Home / Self Care | Payer: Self-pay | Source: Ambulatory Visit | Attending: Oral Surgery

## 2018-05-10 ENCOUNTER — Encounter (HOSPITAL_COMMUNITY): Payer: Self-pay

## 2018-05-10 ENCOUNTER — Ambulatory Visit (HOSPITAL_COMMUNITY)
Admission: RE | Admit: 2018-05-10 | Discharge: 2018-05-10 | Disposition: A | Discharge status: Home / Self Care | Payer: Medicare HMO | Source: Ambulatory Visit | Attending: Oral Surgery | Admitting: Oral Surgery

## 2018-05-10 DIAGNOSIS — Z87891 Personal history of nicotine dependence: Secondary | ICD-10-CM | POA: Insufficient documentation

## 2018-05-10 DIAGNOSIS — K219 Gastro-esophageal reflux disease without esophagitis: Secondary | ICD-10-CM | POA: Insufficient documentation

## 2018-05-10 DIAGNOSIS — E119 Type 2 diabetes mellitus without complications: Secondary | ICD-10-CM | POA: Diagnosis not present

## 2018-05-10 DIAGNOSIS — M109 Gout, unspecified: Secondary | ICD-10-CM | POA: Diagnosis not present

## 2018-05-10 DIAGNOSIS — E785 Hyperlipidemia, unspecified: Secondary | ICD-10-CM | POA: Diagnosis not present

## 2018-05-10 DIAGNOSIS — I10 Essential (primary) hypertension: Secondary | ICD-10-CM | POA: Insufficient documentation

## 2018-05-10 DIAGNOSIS — Z79899 Other long term (current) drug therapy: Secondary | ICD-10-CM | POA: Diagnosis not present

## 2018-05-10 DIAGNOSIS — K589 Irritable bowel syndrome without diarrhea: Secondary | ICD-10-CM | POA: Diagnosis not present

## 2018-05-10 DIAGNOSIS — E039 Hypothyroidism, unspecified: Secondary | ICD-10-CM | POA: Insufficient documentation

## 2018-05-10 DIAGNOSIS — K029 Dental caries, unspecified: Secondary | ICD-10-CM | POA: Insufficient documentation

## 2018-05-10 DIAGNOSIS — F329 Major depressive disorder, single episode, unspecified: Secondary | ICD-10-CM | POA: Diagnosis not present

## 2018-05-10 DIAGNOSIS — J45909 Unspecified asthma, uncomplicated: Secondary | ICD-10-CM | POA: Diagnosis not present

## 2018-05-10 HISTORY — DX: Unspecified asthma, uncomplicated: J45.909

## 2018-05-10 HISTORY — DX: Gastro-esophageal reflux disease without esophagitis: K21.9

## 2018-05-10 HISTORY — DX: Irritable bowel syndrome, unspecified: K58.9

## 2018-05-10 HISTORY — DX: Unspecified osteoarthritis, unspecified site: M19.90

## 2018-05-10 HISTORY — DX: Hypothyroidism, unspecified: E03.9

## 2018-05-10 HISTORY — DX: Depression, unspecified: F32.A

## 2018-05-10 HISTORY — DX: Major depressive disorder, single episode, unspecified: F32.9

## 2018-05-10 HISTORY — PX: TOOTH EXTRACTION: SHX859

## 2018-05-10 LAB — CBC
HEMATOCRIT: 40.8 % (ref 36.0–46.0)
HEMOGLOBIN: 12.8 g/dL (ref 12.0–15.0)
MCH: 29 pg (ref 26.0–34.0)
MCHC: 31.4 g/dL (ref 30.0–36.0)
MCV: 92.5 fL (ref 78.0–100.0)
Platelets: 209 10*3/uL (ref 150–400)
RBC: 4.41 MIL/uL (ref 3.87–5.11)
RDW: 13.9 % (ref 11.5–15.5)
WBC: 8.2 10*3/uL (ref 4.0–10.5)

## 2018-05-10 LAB — GLUCOSE, CAPILLARY
GLUCOSE-CAPILLARY: 115 mg/dL — AB (ref 70–99)
Glucose-Capillary: 210 mg/dL — ABNORMAL HIGH (ref 70–99)
Glucose-Capillary: 172 mg/dL — ABNORMAL HIGH (ref 70–99)

## 2018-05-10 LAB — BASIC METABOLIC PANEL
ANION GAP: 16 — AB (ref 5–15)
BUN: 5 mg/dL — ABNORMAL LOW (ref 8–23)
CALCIUM: 8.2 mg/dL — AB (ref 8.9–10.3)
CO2: 19 mmol/L — AB (ref 22–32)
Chloride: 106 mmol/L (ref 98–111)
Creatinine, Ser: 0.97 mg/dL (ref 0.44–1.00)
GFR calc non Af Amer: 58 mL/min — ABNORMAL LOW (ref >60)
Glucose, Bld: 112 mg/dL — ABNORMAL HIGH (ref 70–99)
Potassium: 3.7 mmol/L (ref 3.5–5.1)
Sodium: 141 mmol/L (ref 135–145)

## 2018-05-10 LAB — HEMOGLOBIN A1C
Hgb A1c MFr Bld: 6.4 % — ABNORMAL HIGH (ref 4.8–5.6)
MEAN PLASMA GLUCOSE: 136.98 mg/dL

## 2018-05-10 SURGERY — DENTAL RESTORATION/EXTRACTIONS
Anesthesia: General | Laterality: Bilateral

## 2018-05-10 SURGERY — Surgical Case
Anesthesia: *Unknown

## 2018-05-10 MED ORDER — CEFAZOLIN SODIUM-DEXTROSE 2-4 GM/100ML-% IV SOLN
2.0000 g | INTRAVENOUS | Status: AC
  Administered 2018-05-10: 2 g via INTRAVENOUS

## 2018-05-10 MED ORDER — OXYMETAZOLINE HCL 0.05 % NA SOLN
NASAL | Status: DC | PRN
  Administered 2018-05-10 (×2): 2 via NASAL

## 2018-05-10 MED ORDER — ROCURONIUM BROMIDE 50 MG/5ML IV SOSY
PREFILLED_SYRINGE | INTRAVENOUS | Status: DC | PRN
  Administered 2018-05-10: 50 mg via INTRAVENOUS

## 2018-05-10 MED ORDER — AMOXICILLIN 500 MG PO CAPS: 500.0000 mg | ORAL_CAPSULE | Freq: Three times a day (TID) | ORAL | 0 refills | Status: DC

## 2018-05-10 MED ORDER — LIDOCAINE-EPINEPHRINE 2 %-1:100000 IJ SOLN
INTRAMUSCULAR | Status: DC | PRN
  Administered 2018-05-10: 14 mL

## 2018-05-10 MED ORDER — ONDANSETRON HCL 4 MG/2ML IJ SOLN
INTRAMUSCULAR | Status: DC | PRN
  Administered 2018-05-10: 4 mg via INTRAVENOUS

## 2018-05-10 MED ORDER — INSULIN ASPART 100 UNIT/ML ~~LOC~~ SOLN
SUBCUTANEOUS | Status: AC
  Filled 2018-05-10: qty 1

## 2018-05-10 MED ORDER — LACTATED RINGERS IV SOLN
INTRAVENOUS | Status: DC | PRN
  Administered 2018-05-10: 10:00:00 via INTRAVENOUS

## 2018-05-10 MED ORDER — PROMETHAZINE HCL 25 MG/ML IJ SOLN: 6.2500 mg | INTRAMUSCULAR | Status: DC | PRN

## 2018-05-10 MED ORDER — PROPOFOL 10 MG/ML IV BOLUS
INTRAVENOUS | Status: AC
  Filled 2018-05-10: qty 20

## 2018-05-10 MED ORDER — SUGAMMADEX SODIUM 200 MG/2ML IV SOLN
INTRAVENOUS | Status: DC | PRN
  Administered 2018-05-10: 310 mg via INTRAVENOUS

## 2018-05-10 MED ORDER — SODIUM CHLORIDE 0.9 % IR SOLN
Status: DC | PRN
  Administered 2018-05-10: 1000 mL

## 2018-05-10 MED ORDER — LIDOCAINE 2% (20 MG/ML) 5 ML SYRINGE
INTRAMUSCULAR | Status: DC | PRN
  Administered 2018-05-10: 100 mg via INTRAVENOUS

## 2018-05-10 MED ORDER — LIDOCAINE-EPINEPHRINE 2 %-1:100000 IJ SOLN
INTRAMUSCULAR | Status: AC
  Filled 2018-05-10: qty 1

## 2018-05-10 MED ORDER — PROPOFOL 10 MG/ML IV BOLUS
INTRAVENOUS | Status: DC | PRN
  Administered 2018-05-10: 180 mg via INTRAVENOUS

## 2018-05-10 MED ORDER — HYDROCODONE-ACETAMINOPHEN 10-325 MG PO TABS: 1.0000 | ORAL_TABLET | Freq: Four times a day (QID) | ORAL | 0 refills | Status: DC | PRN

## 2018-05-10 MED ORDER — ACETAMINOPHEN 10 MG/ML IV SOLN: 1000.0000 mg | Freq: Once | INTRAVENOUS | Status: DC | PRN

## 2018-05-10 MED ORDER — HYDROCODONE-ACETAMINOPHEN 7.5-325 MG PO TABS: 1.0000 | ORAL_TABLET | Freq: Once | ORAL | Status: DC | PRN

## 2018-05-10 MED ORDER — DEXAMETHASONE SODIUM PHOSPHATE 10 MG/ML IJ SOLN
INTRAMUSCULAR | Status: DC | PRN
  Administered 2018-05-10: 10 mg via INTRAVENOUS

## 2018-05-10 MED ORDER — MEPERIDINE HCL 50 MG/ML IJ SOLN: 6.2500 mg | INTRAMUSCULAR | Status: DC | PRN

## 2018-05-10 MED ORDER — HYDROMORPHONE HCL 1 MG/ML IJ SOLN: 0.2500 mg | INTRAMUSCULAR | Status: DC | PRN

## 2018-05-10 MED ORDER — FENTANYL CITRATE (PF) 250 MCG/5ML IJ SOLN
INTRAMUSCULAR | Status: AC
  Filled 2018-05-10: qty 5

## 2018-05-10 MED ORDER — INSULIN ASPART 100 UNIT/ML ~~LOC~~ SOLN
3.0000 [IU] | Freq: Once | SUBCUTANEOUS | Status: AC
  Administered 2018-05-10: 3 [IU] via SUBCUTANEOUS

## 2018-05-10 MED ORDER — OXYMETAZOLINE HCL 0.05 % NA SOLN
NASAL | Status: AC
  Filled 2018-05-10: qty 15

## 2018-05-10 MED ORDER — 0.9 % SODIUM CHLORIDE (POUR BTL) OPTIME
TOPICAL | Status: DC | PRN
  Administered 2018-05-10: 1000 mL

## 2018-05-10 MED ORDER — FENTANYL CITRATE (PF) 100 MCG/2ML IJ SOLN
INTRAMUSCULAR | Status: DC | PRN
  Administered 2018-05-10 (×3): 50 ug via INTRAVENOUS

## 2018-05-10 SURGICAL SUPPLY — 37 items
BLADE SURG 15 STRL LF DISP TIS (BLADE) ×1 IMPLANT
BLADE SURG 15 STRL SS (BLADE) ×2
BUR CROSS CUT FISSURE 1.6 (BURR) ×2 IMPLANT
BUR CROSS CUT FISSURE 1.6MM (BURR) ×1
BUR EGG ELITE 4.0 (BURR) ×2 IMPLANT
BUR EGG ELITE 4.0MM (BURR) ×1
CANISTER SUCT 3000ML PPV (MISCELLANEOUS) ×3 IMPLANT
COVER SURGICAL LIGHT HANDLE (MISCELLANEOUS) ×3 IMPLANT
DECANTER SPIKE VIAL GLASS SM (MISCELLANEOUS) ×3 IMPLANT
DRAPE U-SHAPE 76X120 STRL (DRAPES) ×3 IMPLANT
GAUZE PACKING FOLDED 2  STR (GAUZE/BANDAGES/DRESSINGS) ×2
GAUZE PACKING FOLDED 2 STR (GAUZE/BANDAGES/DRESSINGS) ×1 IMPLANT
GLOVE BIO SURGEON STRL SZ 6.5 (GLOVE) IMPLANT
GLOVE BIO SURGEON STRL SZ7 (GLOVE) IMPLANT
GLOVE BIO SURGEON STRL SZ7.5 (GLOVE) ×3 IMPLANT
GLOVE BIO SURGEONS STRL SZ 6.5 (GLOVE)
GLOVE BIOGEL PI IND STRL 6.5 (GLOVE) IMPLANT
GLOVE BIOGEL PI IND STRL 7.0 (GLOVE) IMPLANT
GLOVE BIOGEL PI INDICATOR 6.5 (GLOVE)
GLOVE BIOGEL PI INDICATOR 7.0 (GLOVE)
GOWN STRL REUS W/ TWL LRG LVL3 (GOWN DISPOSABLE) ×1 IMPLANT
GOWN STRL REUS W/ TWL XL LVL3 (GOWN DISPOSABLE) ×1 IMPLANT
GOWN STRL REUS W/TWL LRG LVL3 (GOWN DISPOSABLE) ×2
GOWN STRL REUS W/TWL XL LVL3 (GOWN DISPOSABLE) ×2
IV NS 1000ML (IV SOLUTION) ×2
IV NS 1000ML BAXH (IV SOLUTION) ×1 IMPLANT
KIT BASIN OR (CUSTOM PROCEDURE TRAY) ×3 IMPLANT
KIT TURNOVER KIT B (KITS) ×3 IMPLANT
NEEDLE 22X1 1/2 (OR ONLY) (NEEDLE) ×6 IMPLANT
NS IRRIG 1000ML POUR BTL (IV SOLUTION) ×3 IMPLANT
PAD ARMBOARD 7.5X6 YLW CONV (MISCELLANEOUS) ×3 IMPLANT
SPONGE SURGIFOAM ABS GEL 12-7 (HEMOSTASIS) IMPLANT
SUT CHROMIC 3 0 PS 2 (SUTURE) ×3 IMPLANT
SYR CONTROL 10ML LL (SYRINGE) ×3 IMPLANT
TRAY ENT MC OR (CUSTOM PROCEDURE TRAY) ×3 IMPLANT
TUBING IRRIGATION (MISCELLANEOUS) ×3 IMPLANT
YANKAUER SUCT BULB TIP NO VENT (SUCTIONS) ×3 IMPLANT

## 2018-05-10 NOTE — H&P (Signed)
HISTORY AND PHYSICAL  Alejandra Duffy is a 67 y.o. female patient with CC: referred for removal remaining lower teeth for denture fabrication.  No diagnosis found.  Past Medical History:  Diagnosis Date  . Arthritis   . Asthma   . Depression   . Diabetes mellitus    type II  . GERD (gastroesophageal reflux disease)   . Gout   . Hyperlipemia   . Hypertension   . Hypothyroidism   . IBS (irritable bowel syndrome)    diarrhea  . Thyroid disease     Current Facility-Administered Medications  Medication Dose Route Frequency Provider Last Rate Last Dose  . ceFAZolin (ANCEF) IVPB 2g/100 mL premix  2 g Intravenous On Call to OR Ocie Doyne, DDS       Allergies  Allergen Reactions  . Ibuprofen Other (See Comments)    Gasteric issues  . Tetracyclines & Related Other (See Comments)    Builds up fluid in the brain  . Tetracyclines & Related Other (See Comments)    Demeclocycline-Fluid on the brain   Active Problems:   * No active hospital problems. *  Vitals: Blood pressure 134/61, pulse 66, temperature 98.1 F (36.7 C), temperature source Oral, resp. rate 18, height 5\' 4"  (1.626 m), weight 77.1 kg, SpO2 100 %. Lab results: Results for orders placed or performed during the hospital encounter of 05/10/18 (from the past 24 hour(s))  CBC     Status: None   Collection Time: 05/10/18  8:15 AM  Result Value Ref Range   WBC 8.2 4.0 - 10.5 K/uL   RBC 4.41 3.87 - 5.11 MIL/uL   Hemoglobin 12.8 12.0 - 15.0 g/dL   HCT 72.5 36.6 - 44.0 %   MCV 92.5 78.0 - 100.0 fL   MCH 29.0 26.0 - 34.0 pg   MCHC 31.4 30.0 - 36.0 g/dL   RDW 34.7 42.5 - 95.6 %   Platelets 209 150 - 400 K/uL  Basic metabolic panel     Status: Abnormal   Collection Time: 05/10/18  8:15 AM  Result Value Ref Range   Sodium 141 135 - 145 mmol/L   Potassium 3.7 3.5 - 5.1 mmol/L   Chloride 106 98 - 111 mmol/L   CO2 19 (L) 22 - 32 mmol/L   Glucose, Bld 112 (H) 70 - 99 mg/dL   BUN 5 (L) 8 - 23 mg/dL   Creatinine, Ser  3.87 0.44 - 1.00 mg/dL   Calcium 8.2 (L) 8.9 - 10.3 mg/dL   GFR calc non Af Amer 58 (L) >60 mL/min   GFR calc Af Amer >60 >60 mL/min   Anion gap 16 (H) 5 - 15  Hemoglobin A1c     Status: Abnormal   Collection Time: 05/10/18  8:15 AM  Result Value Ref Range   Hgb A1c MFr Bld 6.4 (H) 4.8 - 5.6 %   Mean Plasma Glucose 136.98 mg/dL   Radiology Results: No results found. General appearance: alert, cooperative and no distress Head: Normocephalic, without obvious abnormality, atraumatic Eyes: negative Nose: Nares normal. Septum midline. Mucosa normal. No drainage or sinus tenderness. Throat: edentulous maxilla, carious lower anterior teeth. pharynx clear Neck: no adenopathy, supple, symmetrical, trachea midline and thyroid not enlarged, symmetric, no tenderness/mass/nodules Resp: clear to auscultation bilaterally Cardio: regular rate and rhythm, S1, S2 normal, no murmur, click, rub or gallop  Assessment: non-restorable teeth secondary to dental caries  Plan: multiple extractions with alveoloplasty. GA. Day surgery.   Ocie Doyne 05/10/2018

## 2018-05-10 NOTE — Transfer of Care (Signed)
Immediate Anesthesia Transfer of Care Note  Patient: Alejandra Duffy  Procedure(s) Performed: DENTAL RESTORATION/EXTRACTIONS (Bilateral )  Patient Location: PACU  Anesthesia Type:General  Level of Consciousness: awake and patient cooperative  Airway & Oxygen Therapy: Patient Spontanous Breathing and Patient connected to face mask oxygen  Post-op Assessment: Report given to RN and Post -op Vital signs reviewed and stable  Post vital signs: Reviewed and stable  Last Vitals:  Vitals Value Taken Time  BP 128/72 05/10/2018 10:53 AM  Temp    Pulse 83 05/10/2018 10:54 AM  Resp 17 05/10/2018 10:54 AM  SpO2 97 % 05/10/2018 10:54 AM  Vitals shown include unvalidated device data.  Last Pain:  Vitals:   05/10/18 0757  TempSrc: Oral  PainSc: 0-No pain      Patients Stated Pain Goal: 0 (05/10/18 0757)  Complications: No apparent anesthesia complications

## 2018-05-10 NOTE — Op Note (Signed)
NAME: Alejandra Duffy, Alejandra Duffy MEDICAL RECORD [id number] ACCOUNT [id number] DATE OF BIRTH:04/23/1951 FACILITY: [facility] LOCATION: [facility] PHYSICIAN:Arlynn Mcdermid M. Gid Schoffstall, DDS  OPERATIVE REPORT  DATE OF PROCEDURE:  05/10/2018  PREOPERATIVE DIAGNOSIS:  Nonrestorable teeth numbers 20, 22, 23, 24, 25, 26, 27.  POSTOPERATIVE DIAGNOSIS:  Nonrestorable teeth numbers 20 22, 23, 24, 25, 26, 27.  PROCEDURE:  Extraction of teeth numbers 20, 22, 23, 24, 25, 26 27, alveoplasty right and left mandible.  SURGEON:  Ocie Doyne, DDS  ANESTHESIA:  General nasal intubation, Houser attending.  DESCRIPTION OF PROCEDURE:  The patient was taken to the operating room and placed on the table in supine position.  General anesthesia was administered intravenously, and a nasal endotracheal tube was placed and secured.  The patient was draped for  surgery.  After the eyes were protected and timeout was performed, the posterior pharynx was suctioned and a throat pack was placed.  Lidocaine 2% 1:100,000 epinephrine was infiltrated in an inferior alveolar block on the right and left side, and then  buccal infiltration of the anterior mandible, a total of 14 mL, was utilized.  A bite block was placed on the right side of the mouth, and a sweetheart was used to retract the tongue.  A 15 blade was used to make an incision overlying tooth #19, 20, 21  and then carried in the buccal and lingual gingival sulcus around teeth numbers 22, 23, 24, 25, 26 and 27.  The periosteum was used to reflect the periosteum.  The gingival tissue was trimmed so that primary closure could be achieved when suturing was  done.  Tooth #20 was found to be embedded in the bone and a small portion of the root.  The Stryker handpiece was used to remove bone around this root, and then the root was removed using the 301 elevator.  Then, the anterior teeth were luxated with a  301 elevator and removed from the mouth with the Ash forceps.  The sockets were curetted.   The bone was trimmed with the rongeurs.  The periosteum was reflected to expose rough irregular rib margins of the sockets, and alveoplasty was performed using the  egg-shaped bur.  Then, the area was irrigated and closed with 3-0 chromic.  The oral cavity was then irrigated and suctioned.  Throat pack was removed.  The patient was left in the care of anesthesia for extubation and transport to recovery room with  plans for discharge home through day surgery.  ESTIMATED BLOOD LOSS:  Minimal.  COMPLICATIONS:  None.  SPECIMENS:  None.  LN/NUANCE  D:05/10/2018 T:05/10/2018 JOB:[id number]

## 2018-05-10 NOTE — Anesthesia Preprocedure Evaluation (Addendum)
Anesthesia Evaluation  Patient identified by MRN, date of birth, ID band Patient awake    Reviewed: Allergy & Precautions, NPO status , Patient's Chart, lab work & pertinent test results  History of Anesthesia Complications (+) PSEUDOCHOLINESTERASE DEFICIENCY  Airway Mallampati: II  TM Distance: >3 FB Neck ROM: Full    Dental no notable dental hx. (+) Poor Dentition, Edentulous Upper,    Pulmonary asthma , former smoker,    Pulmonary exam normal breath sounds clear to auscultation       Cardiovascular hypertension, Pt. on medications Normal cardiovascular exam Rhythm:Regular Rate:Normal     Neuro/Psych Depression    GI/Hepatic GERD  ,  Endo/Other  diabetesHypothyroidism   Renal/GU      Musculoskeletal  (+) Arthritis ,   Abdominal   Peds  Hematology   Anesthesia Other Findings   Reproductive/Obstetrics                           Anesthesia Physical Anesthesia Plan  ASA: III  Anesthesia Plan: General   Post-op Pain Management:    Induction: Intravenous  PONV Risk Score and Plan: 3 and Treatment may vary due to age or medical condition, Ondansetron and Dexamethasone  Airway Management Planned: Oral ETT  Additional Equipment:   Intra-op Plan:   Post-operative Plan: Extubation in OR  Informed Consent: I have reviewed the patients History and Physical, chart, labs and discussed the procedure including the risks, benefits and alternatives for the proposed anesthesia with the patient or authorized representative who has indicated his/her understanding and acceptance.   Dental advisory given  Plan Discussed with: CRNA  Anesthesia Plan Comments:         Anesthesia Quick Evaluation

## 2018-05-10 NOTE — Anesthesia Procedure Notes (Signed)
Procedure Name: Intubation Date/Time: 05/10/2018 10:11 AM Performed by: Rosiland Oz, CRNA Pre-anesthesia Checklist: Patient identified, Emergency Drugs available, Suction available, Patient being monitored and Timeout performed Patient Re-evaluated:Patient Re-evaluated prior to induction Oxygen Delivery Method: Circle system utilized Preoxygenation: Pre-oxygenation with 100% oxygen Induction Type: IV induction Ventilation: Mask ventilation without difficulty Laryngoscope Size: Miller and 2 Grade View: Grade I Nasal Tubes: Right, Nasal prep performed, Nasal Rae and Magill forceps - small, utilized Number of attempts: 1 Placement Confirmation: ETT inserted through vocal cords under direct vision,  positive ETCO2 and breath sounds checked- equal and bilateral Tube secured with: Tape Dental Injury: Teeth and Oropharynx as per pre-operative assessment

## 2018-05-10 NOTE — Op Note (Signed)
05/10/2018  10:45 AM  PATIENT:  Alejandra Duffy  67 y.o. female  PRE-OPERATIVE DIAGNOSIS:  NONRESTORABLE TEETH # 20, 22, 23, 24, 25, 26, 27  POST-OPERATIVE DIAGNOSIS:  SAME  PROCEDURE:  Procedure(s): EXTRACTION TEETH # 20, 22, 23, 24, 25, 26, 27, ALVEOLOPLASTY RIGHT AND LEFT MANDIBLE  SURGEON:  Surgeon(s): Ocie Doyne, DDS  ANESTHESIA:   local and general  EBL:  minimal  DRAINS: none   SPECIMEN:  No Specimen  COUNTS:  YES  PLAN OF CARE: Discharge to home after PACU  PATIENT DISPOSITION:  PACU - hemodynamically stable.   PROCEDURE DETAILS: Dictation # [id number]  Alejandra Duffy, DMD 05/10/2018 10:45 AM

## 2018-05-10 NOTE — Anesthesia Postprocedure Evaluation (Signed)
Anesthesia Post Note  Patient: Alejandra Duffy  Procedure(s) Performed: DENTAL RESTORATION/EXTRACTIONS (Bilateral )     Patient location during evaluation: PACU Anesthesia Type: General Level of consciousness: awake and alert Pain management: pain level controlled Vital Signs Assessment: post-procedure vital signs reviewed and stable Respiratory status: spontaneous breathing, nonlabored ventilation, respiratory function stable and patient connected to nasal cannula oxygen Cardiovascular status: blood pressure returned to baseline and stable Postop Assessment: no apparent nausea or vomiting Anesthetic complications: no    Last Vitals:  Vitals:   05/10/18 1138 05/10/18 1200  BP: (!) 112/59 127/71  Pulse: 65 66  Resp: 14 16  Temp: (!) 36.2 C   SpO2: 94% 95%    Last Pain:  Vitals:   05/10/18 1123  TempSrc:   PainSc: 0-No pain                 Trevor Iha

## 2018-05-11 ENCOUNTER — Encounter (HOSPITAL_COMMUNITY): Payer: Self-pay | Admitting: Oral Surgery

## 2018-05-26 ENCOUNTER — Other Ambulatory Visit: Payer: Self-pay | Admitting: Orthopaedic Surgery

## 2018-05-26 DIAGNOSIS — M5030 Other cervical disc degeneration, unspecified cervical region: Secondary | ICD-10-CM

## 2018-06-06 ENCOUNTER — Ambulatory Visit
Admission: RE | Admit: 2018-06-06 | Discharge: 2018-06-06 | Disposition: A | Discharge status: Home / Self Care | Payer: Medicare HMO | Source: Ambulatory Visit | Attending: Orthopaedic Surgery | Admitting: Orthopaedic Surgery

## 2018-06-06 DIAGNOSIS — M5030 Other cervical disc degeneration, unspecified cervical region: Secondary | ICD-10-CM

## 2018-06-07 ENCOUNTER — Ambulatory Visit
Admission: RE | Admit: 2018-06-07 | Discharge: 2018-06-07 | Disposition: A | Discharge status: Home / Self Care | Payer: Medicare HMO | Source: Ambulatory Visit | Attending: Cardiology | Admitting: Cardiology

## 2018-06-07 DIAGNOSIS — Z1231 Encounter for screening mammogram for malignant neoplasm of breast: Secondary | ICD-10-CM

## 2019-02-11 ENCOUNTER — Encounter (HOSPITAL_COMMUNITY): Payer: Self-pay | Admitting: Emergency Medicine

## 2019-02-11 ENCOUNTER — Emergency Department (HOSPITAL_COMMUNITY)
Admission: EM | Admit: 2019-02-11 | Discharge: 2019-02-12 | Disposition: A | Discharge status: Home / Self Care | Payer: Medicare HMO | Attending: Emergency Medicine | Admitting: Emergency Medicine

## 2019-02-11 ENCOUNTER — Other Ambulatory Visit: Payer: Self-pay

## 2019-02-11 DIAGNOSIS — Y999 Unspecified external cause status: Secondary | ICD-10-CM | POA: Insufficient documentation

## 2019-02-11 DIAGNOSIS — I10 Essential (primary) hypertension: Secondary | ICD-10-CM | POA: Diagnosis not present

## 2019-02-11 DIAGNOSIS — E119 Type 2 diabetes mellitus without complications: Secondary | ICD-10-CM | POA: Insufficient documentation

## 2019-02-11 DIAGNOSIS — Z79899 Other long term (current) drug therapy: Secondary | ICD-10-CM | POA: Diagnosis not present

## 2019-02-11 DIAGNOSIS — W208XXA Other cause of strike by thrown, projected or falling object, initial encounter: Secondary | ICD-10-CM | POA: Diagnosis not present

## 2019-02-11 DIAGNOSIS — Y92019 Unspecified place in single-family (private) house as the place of occurrence of the external cause: Secondary | ICD-10-CM | POA: Insufficient documentation

## 2019-02-11 DIAGNOSIS — Z7984 Long term (current) use of oral hypoglycemic drugs: Secondary | ICD-10-CM | POA: Diagnosis not present

## 2019-02-11 DIAGNOSIS — S61412A Laceration without foreign body of left hand, initial encounter: Secondary | ICD-10-CM | POA: Diagnosis present

## 2019-02-11 DIAGNOSIS — Y93E5 Activity, floor mopping and cleaning: Secondary | ICD-10-CM | POA: Diagnosis not present

## 2019-02-11 DIAGNOSIS — Z87891 Personal history of nicotine dependence: Secondary | ICD-10-CM | POA: Insufficient documentation

## 2019-02-11 DIAGNOSIS — E039 Hypothyroidism, unspecified: Secondary | ICD-10-CM | POA: Diagnosis not present

## 2019-02-11 DIAGNOSIS — Z23 Encounter for immunization: Secondary | ICD-10-CM | POA: Diagnosis not present

## 2019-02-11 NOTE — ED Triage Notes (Signed)
Patient presents with skin tear at left hand sustained this evening from a broken glass vase , bleeding controlled - dressing applied at triage .

## 2019-02-12 DIAGNOSIS — S61412A Laceration without foreign body of left hand, initial encounter: Secondary | ICD-10-CM | POA: Diagnosis not present

## 2019-02-12 MED ORDER — TETANUS-DIPHTH-ACELL PERTUSSIS 5-2.5-18.5 LF-MCG/0.5 IM SUSP
0.5000 mL | Freq: Once | INTRAMUSCULAR | Status: AC
  Administered 2019-02-12: 0.5 mL via INTRAMUSCULAR
  Filled 2019-02-12: qty 0.5

## 2019-02-12 NOTE — ED Notes (Signed)
Rn covered wound with bacitracin, 2x2 gauze and Kurlex. No bleeding noted.

## 2019-02-12 NOTE — Discharge Instructions (Signed)
Keep wound clean and dry.  The small skin flap will probably dry up and fall off like we talked about but wound will continue to heal.  Monitor for any redness, swelling, drainage, etc. Follow-up with your primary care doctor. Return here for any new/acute changes.

## 2019-02-12 NOTE — ED Provider Notes (Signed)
[facility]   CSN: [id number] Arrival date & time: 02/11/19  2146     History   Chief Complaint Chief Complaint  Patient presents with   Hand Laceration    HPI Alejandra Duffy is a 68 y.o. female.     The history is provided by the patient and medical records.     68 year old female with history of arthritis, asthma, depression, diabetes, GERD, hyperlipidemia, hypertension, thyroid disease, presenting to the ED with a left hand laceration.  Reports she was dusting some shelves at home and she knocked a vase over and it broke on her hand.  She has an avulsion type laceration in the webspace of left hand between thumb and index finger.  There is no active bleeding.  States her last tetanus was given in the 1960s.  Past Medical History:  Diagnosis Date   Arthritis    Asthma    Depression    Diabetes mellitus    type II   GERD (gastroesophageal reflux disease)    Gout    Hyperlipemia    Hypertension    Hypothyroidism    IBS (irritable bowel syndrome)    diarrhea   Thyroid disease     Patient Active Problem List   Diagnosis Date Noted   Dyspnea 05/04/2012   Cough 04/04/2012   Hypertension 04/04/2012    Past Surgical History:  Procedure Laterality Date   CHOLECYSTECTOMY     COLONOSCOPY     LEFT HEART CATH AND CORONARY ANGIOGRAPHY N/A 04/20/2017   Procedure: LEFT HEART CATH AND CORONARY ANGIOGRAPHY;  Surgeon: Rinaldo CloudHarwani, Mohan, MD;  Location: [facility] INVASIVE CV LAB;  Service: Cardiovascular;  Laterality: N/A;   TOOTH EXTRACTION Bilateral 05/10/2018   Procedure: DENTAL RESTORATION/EXTRACTIONS;  Surgeon: Ocie DoyneJensen, Scott, DDS;  [facility];  Service: Oral Surgery;  Laterality: Bilateral;   VESICOVAGINAL FISTULA CLOSURE W/ TAH       OB History   No obstetric history on file.      Home Medications    Prior to Admission medications   Medication Sig Start Date End Date Taking? Authorizing  Provider  albuterol (PROVENTIL HFA) 108 (90 BASE) MCG/ACT inhaler Inhale 2 puffs into the lungs every 6 (six) hours as needed for wheezing or shortness of breath.     [provider]  amLODipine (NORVASC) 5 MG tablet Take 5 mg by mouth daily.    [provider]  amoxicillin (AMOXIL) 500 MG capsule Take 1 capsule (500 mg total) by mouth 3 (three) times daily. 05/10/18   Ocie DoyneJensen, Scott, DDS  citalopram (CELEXA) 40 MG tablet Take 40 mg by mouth daily.    [provider]  colchicine 0.6 MG tablet Take 0.6 mg by mouth 2 (two) times daily as needed (gout).     [provider]  dicyclomine (BENTYL) 20 MG tablet Take 20 mg by mouth 2 (two) times daily.    [provider]  diphenhydrAMINE (BENADRYL) 25 MG tablet Take 50 mg by mouth daily as needed for allergies.    [provider]  diphenhydrAMINE (BENADRYL) 25 MG tablet Take 25 mg by mouth every 6 (six) hours as needed for allergies.    [provider]  gabapentin (NEURONTIN) 300 MG capsule Take 300 mg by mouth 2 (two) times daily.    [provider]  HYDROcodone-acetaminophen (NORCO) 10-325 MG tablet Take 1 tablet by mouth every 6 (six) hours as needed. 05/10/18   Ocie DoyneJensen, Scott, DDS  levothyroxine (SYNTHROID,  LEVOTHROID) 50 MCG tablet Take 50 mcg by mouth daily before breakfast.     [provider]  loperamide (IMODIUM) 2 MG capsule Take 1 capsule (2 mg total) by mouth 4 (four) times daily as needed for diarrhea or loose stools. 11/14/17   Ofilia Neaslark, Michael L, PA-C  losartan (COZAAR) 50 MG tablet Take 50 mg by mouth daily.    [provider]  metFORMIN (GLUCOPHAGE) 500 MG tablet Take 500 mg by mouth 2 (two) times daily with a meal.    [provider]  nebivolol (BYSTOLIC) 10 MG tablet Take 10 mg by mouth daily.    [provider]  omeprazole (PRILOSEC) 20 MG capsule Take 20 mg by mouth 2 (two) times daily before a meal.    [provider]    simvastatin (ZOCOR) 40 MG tablet Take 40 mg by mouth daily.    [provider]  traZODone (DESYREL) 50 MG tablet Take 50 mg by mouth at bedtime.    [provider]  traZODone (DESYREL) 50 MG tablet Take 100 mg by mouth at bedtime.    [provider]    Family History Family History  Problem Relation Age of Onset   Asthma Father    Allergies Father    Allergies Brother    Breast cancer Neg Hx     Social History Social History   Tobacco Use   Smoking status: Former Smoker    Packs/day: 0.30    Years: 15.00    Pack years: 4.50    Types: Cigarettes    Quit date: 04/01/2011    Years since quitting: 7.8   Smokeless tobacco: Never Used  Substance Use Topics   Alcohol use: No   Drug use: No     Allergies   Ibuprofen, Tetracyclines & related, and Tetracyclines & related   Review of Systems Review of Systems  Skin: Positive for wound.  All other systems reviewed and are negative.    Physical Exam Updated Vital Signs BP (!) 153/91    Pulse 63    Temp 98.4 F (36.9 C) (Oral)    Resp 16    SpO2 98%   Physical Exam Vitals signs and nursing note reviewed.  Constitutional:      Appearance: She is well-developed.  HENT:     Head: Normocephalic and atraumatic.  Eyes:     Conjunctiva/sclera: Conjunctivae normal.     Pupils: Pupils are equal, round, and reactive to light.  Neck:     Musculoskeletal: Normal range of motion.  Cardiovascular:     Rate and Rhythm: Normal rate and regular rhythm.     Heart sounds: Normal heart sounds.  Pulmonary:     Effort: Pulmonary effort is normal.     Breath sounds: Normal breath sounds.  Abdominal:     General: Bowel sounds are normal.     Palpations: Abdomen is soft.  Musculoskeletal: Normal range of motion.     Comments: Avulsion type laceration in webbed space of left thumb and index finger with small skin flap minimally attached but already appears dusky, wound is hemostatic without active  bleeding, no deep tissue or vessel involvement, full ROM of the fingers without issue, normal distal sensation and cap refill  Skin:    General: Skin is warm and dry.  Neurological:     Mental Status: She is alert and oriented to person, place, and time.      ED Treatments / Results  Labs (all labs ordered are listed,  but only abnormal results are displayed) Labs Reviewed - No data to display  EKG    Radiology No results found.  Procedures Procedures (including critical care time)  Medications Ordered in ED Medications  Tdap (BOOSTRIX) injection 0.5 mL (0.5 mLs Intramuscular Given 02/12/19 0119)     Initial Impression / Assessment and Plan / ED Course  I have reviewed the triage vital signs and the nursing notes.  Pertinent labs & imaging results that were available during my care of the patient were reviewed by me and considered in my medical decision making (see chart for details).  68 y.o. F here with laceration of left hand.  States she was dusting and a vase fell and broke on her hand.  She has avulsion type laceration in webbed space between left thumb and index finger with small skin flap present, however this already appears dusky.  Unfortunately, this is not amenable to repair which is discussed with patient, she acknowledged understanding.  Wound was cleansed and dressed.  Tetanus was updated.  Discussed with her that skin flap is likely going to die but wound should continue healing normally.  She should continue home wound care and dressing changes.  Monitor for any redness, swelling, drainage, etc.  Close follow-up with PCP.  Return here for any new or acute changes.  Final Clinical Impressions(s) / ED Diagnoses   Final diagnoses:  Laceration of left hand without foreign body, initial encounter    ED Discharge Orders    None       Larene Pickett, PA-C 02/12/19 0144    Orpah Greek, MD 02/12/19 [phone]

## 2019-02-20 ENCOUNTER — Other Ambulatory Visit: Payer: Self-pay

## 2019-02-21 ENCOUNTER — Ambulatory Visit: Payer: Medicare HMO | Admitting: Gastroenterology

## 2019-02-21 ENCOUNTER — Encounter: Payer: Self-pay | Admitting: Gastroenterology

## 2019-02-21 VITALS — Ht 65.0 in | Wt 160.0 lb

## 2019-02-21 DIAGNOSIS — K529 Noninfective gastroenteritis and colitis, unspecified: Secondary | ICD-10-CM

## 2019-02-21 NOTE — Progress Notes (Signed)
This patient contacted our office requesting a physician telemedicine consultation regarding clinical questions and/or test results.  Unfortunately, this patient did not have any access to Film/video editor.  Given that, and that she is a new patient for another opinion on chronic problem, our office will contact her for an in person new patient consult visit.

## 2019-03-14 ENCOUNTER — Telehealth: Payer: Self-pay

## 2019-03-14 NOTE — Telephone Encounter (Signed)
Covid-19 screening questions   Do you now or have you had a fever in the last 14 days? No   Do you have any respiratory symptoms of shortness of breath or cough now or in the last 14 days? no  Do you have any family members or close contacts with diagnosed or suspected Covid-19 in the past 14 days? No   Have you been tested for Covid-19 and found to be positive? No         

## 2019-03-15 ENCOUNTER — Encounter: Payer: Self-pay | Admitting: Gastroenterology

## 2019-03-15 ENCOUNTER — Other Ambulatory Visit: Payer: Medicare HMO

## 2019-03-15 ENCOUNTER — Ambulatory Visit (INDEPENDENT_AMBULATORY_CARE_PROVIDER_SITE_OTHER): Payer: Medicare HMO | Admitting: Gastroenterology

## 2019-03-15 ENCOUNTER — Other Ambulatory Visit: Payer: Self-pay

## 2019-03-15 VITALS — BP 102/68 | HR 69 | Temp 98.3°F | Ht 64.0 in | Wt 158.5 lb

## 2019-03-15 DIAGNOSIS — R12 Heartburn: Secondary | ICD-10-CM

## 2019-03-15 DIAGNOSIS — K529 Noninfective gastroenteritis and colitis, unspecified: Secondary | ICD-10-CM

## 2019-03-15 DIAGNOSIS — R634 Abnormal weight loss: Secondary | ICD-10-CM

## 2019-03-15 DIAGNOSIS — R1084 Generalized abdominal pain: Secondary | ICD-10-CM

## 2019-03-15 NOTE — Patient Instructions (Signed)
If you are age 70 or older, your body mass index should be between 23-30. Your Body mass index is 27.21 kg/m. If this is out of the aforementioned range listed, please consider follow up with your Primary Care Provider.  If you are age 67 or younger, your body mass index should be between 19-25. Your Body mass index is 27.21 kg/m. If this is out of the aformentioned range listed, please consider follow up with your Primary Care Provider.   Your provider has requested that you go to the basement level for lab work before leaving today. Press "B" on the elevator. The lab is located at the first door on the left as you exit the elevator.  Please stop taking the dicyclomine(Bentyl)     You have been scheduled for a CT scan of the abdomen and pelvis at [place] ([address]---this is in the same building as Press [facility]).   You are scheduled on                         At                          . You should arrive 15 minutes prior to your appointment time for registration. Please follow the written instructions below on the day of your exam:  WARNING: IF YOU ARE ALLERGIC TO IODINE/X-RAY DYE, PLEASE NOTIFY RADIOLOGY IMMEDIATELY AT [phone]! YOU WILL BE GIVEN A 13 HOUR PREMEDICATION PREP.  1) Do not eat or drink anything after                  (4 hours prior to your test) 2) You have been given 2 bottles of oral contrast to drink. The solution may taste better if refrigerated, but do NOT add ice or any other liquid to this solution. Shake well before drinking.    Drink 1 bottle of contrast @               (2 hours prior to your exam)  Drink 1 bottle of contrast @               (1 hour prior to your exam)  You may take any medications as prescribed with a small amount of water, if necessary. If you take any of the following medications: METFORMIN, GLUCOPHAGE, GLUCOVANCE, AVANDAMET, RIOMET, FORTAMET, Beverly MET, JANUMET, GLUMETZA or METAGLIP, you MAY be asked to HOLD  this medication 48 hours AFTER the exam.  The purpose of you drinking the oral contrast is to aid in the visualization of your intestinal tract. The contrast solution may cause some diarrhea. Depending on your individual set of symptoms, you may also receive an intravenous injection of x-ray contrast/dye. Plan on being at [facility] for 30 minutes or longer, depending on the type of exam you are having performed.  This test typically takes 30-45 minutes to complete.  If you have any questions regarding your exam or if you need to reschedule, you may call the CT department at [phone] between the hours of 8:00 am and 5:00 pm, Monday-Friday.  ________________________________________________________________________

## 2019-03-15 NOTE — Progress Notes (Signed)
[facility] Consult Note:  History: Alejandra Duffy 03/15/2019  Referring provider: Lindaann PascalLong, Scott, PA-C  Reason for consult/chief complaint: Bloated, Abdominal Cramping, and Diarrhea   Subjective  HPI:  Dr. Kinnie ScalesMedoff saw patient in Jan 2019 (and reportedly saw in 2014 as well).  12/2017 office note from Dr. Sharrell KuJeffrey Medoff as follows: "Alejandra Duffy is a 68 y.o. female, returning 2 months after colonoscopy, performed for adenoma surveillance, and to further evaluate chronic diarrhea, and abdominal pain. Her procedure was normal, including random mucosal biopsies, without pathology to explain her symptoms. Her diarrhea continues despite avoiding lactose, sorbitol, and caffeine, recommended at her last visit. She has 4-6 liquid stools daily, with urgency, limiting her social activities. She has new onset of severe pyrosis, despite Prilosec 20 mg BID for the past several years. Occasional regurgitation, about x1 weekly, awakening her from sleep. Mild solid food dysphagia, without impaction, especially with eating salads. Appetite is good, weight is stable. Tried taking ASA 81 mg daily but had to discontinue because of worsening dyspepsia. No NSAID OTC use. No alcohol or tobacco."   His impression was diarrhea from metformin, and recommended holding this for a couple weeks with primary care approval.  He also felt a reflux symptoms were NSAID induced.  EGD was planned, no report available, a phone note from 02/04/2018 available through care everywhere only indicates negative CLO testing. ________________________________   This is a 68 year old woman referred by primary care noted above for another opinion on longstanding GI symptoms.  She reports decades of chronic abdominal pain, both epigastric and fairly constant lower abdominal crampy pain and bloating.  She says everything she eats "goes right through me".  On good days she has 2 BMs that are still loose and nonbloody,  and on worst days has 5 or 6 BMs.  She feels the urgent need for BM soon after eating.  Dicyclomine has been of little or no help to her, but she continues to take it.  She believes this problem is all been going on since the 1980s, but admits that sometimes her memory fails her when recollecting dates. She describes a complicated cholecystectomy in the late 1970s, and says she was hospitalized for a month afterwards. Dr. Kinnie ScalesMedoff is seen her over the last 5 to 7 years and done multiple endoscopic procedures.  We do not have the reports, but they are referenced in some office notes that are available through care everywhere. She has chronic heartburn that is generally well controlled on twice daily omeprazole.  Alejandra Duffy denies dysphagia, odynophagia, nausea vomiting early satiety. She is concerned because she believes she has lost at least 20 pounds in the last several months since a primary care visit. Lastly, she does not recall any improvement in the diarrhea during a short period off metformin.  She cannot recall any other medicines she has taken for the symptoms other than the omeprazole for heartburn and dicyclomine for the lower GI symptoms.  ROS:  Review of Systems  Constitutional: Negative for appetite change and unexpected weight change.  HENT: Negative for mouth sores and voice change.   Eyes: Negative for pain and redness.  Respiratory: Negative for cough and shortness of breath.   Cardiovascular: Negative for chest pain and palpitations.  Genitourinary: Negative for dysuria and hematuria.  Musculoskeletal: Negative for arthralgias and myalgias.       Chronic diffuse arthralgias and back pain  Skin: Negative for pallor and rash.  Neurological: Negative for weakness and headaches.  Hematological: Negative for adenopathy.     Past Medical History: Past Medical History:  Diagnosis Date  . Arthritis   . Asthma   . Dementia ([place])   . Depression   . Diabetes mellitus    type II  .  GERD (gastroesophageal reflux disease)   . Gout   . Hyperlipemia   . Hyperparathyroidism ([place])   . Hypertension   . Hypothyroidism   . IBS (irritable bowel syndrome)    diarrhea  . Thyroid disease      Past Surgical History: Past Surgical History:  Procedure Laterality Date  . CHOLECYSTECTOMY    . COLONOSCOPY  2013   DR Medoff  . LEFT HEART CATH AND CORONARY ANGIOGRAPHY N/A 04/20/2017   Procedure: LEFT HEART CATH AND CORONARY ANGIOGRAPHY;  Surgeon: Charolette Forward, MD;  Location: [facility];  Service: Cardiovascular;  Laterality: N/A;  . TOOTH EXTRACTION Bilateral 05/10/2018   Procedure: DENTAL RESTORATION/EXTRACTIONS;  Surgeon: Diona Browner, DDS;  Location: [facility];  Service: Oral Surgery;  Laterality: Bilateral;  . VESICOVAGINAL FISTULA CLOSURE W/ TAH       Family History: Family History  Problem Relation Age of Onset  . Asthma Father   . Allergies Father   . Allergies Brother   . Diabetes Mother   . Breast cancer Neg Hx   . Colon cancer Neg Hx   . Stomach cancer Neg Hx     Social History: Social History   Socioeconomic History  . Marital status: Divorced    Spouse name: Not on file  . Number of children: 2  . Years of education: Not on file  . Highest education level: Not on file  Occupational History  . Occupation: Disabled    Fish farm manager: UNEMPLOYED  Social Needs  . Financial resource strain: Not on file  . Food insecurity    Worry: Not on file    Inability: Not on file  . Transportation needs    Medical: Not on file    Non-medical: Not on file  Tobacco Use  . Smoking status: Former Smoker    Packs/day: 0.30    Years: 15.00    Pack years: 4.50    Types: Cigarettes    Quit date: 04/01/2011    Years since quitting: 7.9  . Smokeless tobacco: Never Used  Substance and Sexual Activity  . Alcohol use: No  . Drug use: No  . Sexual activity: Not on file  Lifestyle  . Physical activity    Days per week: Not on file    Minutes per session: Not on  file  . Stress: Not on file  Relationships  . Social Herbalist on phone: Not on file    Gets together: Not on file    Attends religious service: Not on file    Active member of club or organization: Not on file    Attends meetings of clubs or organizations: Not on file    Relationship status: Not on file  Other Topics Concern  . Not on file  Social History Narrative  . Not on file   Former smoker, has never been a heavy alcohol user.  Allergies: Allergies  Allergen Reactions  . Ibuprofen Other (See Comments)    Gasteric issues  . Tetracyclines & Related Other (See Comments)    Builds up fluid in the brain  . Tetracyclines & Related Other (See Comments)    Demeclocycline-Fluid on the brain    Outpatient Meds: Current Outpatient Medications  Medication Sig Dispense Refill  . albuterol (PROVENTIL HFA) 108 (90 BASE) MCG/ACT inhaler Inhale 2 puffs into the lungs every 6 (six) hours as needed for wheezing or shortness of breath.     Marland Kitchen. amLODipine (NORVASC) 5 MG tablet Take 5 mg by mouth daily.    . citalopram (CELEXA) 40 MG tablet Take 40 mg by mouth daily.    . colchicine 0.6 MG tablet Take 0.6 mg by mouth 2 (two) times daily as needed (gout).     Marland Kitchen. dicyclomine (BENTYL) 20 MG tablet Take 20 mg by mouth 2 (two) times daily.    . diphenhydrAMINE (BENADRYL) 25 MG tablet Take 50 mg by mouth daily as needed for allergies.    . diphenhydrAMINE (BENADRYL) 25 MG tablet Take 25 mg by mouth every 6 (six) hours as needed for allergies.    Marland Kitchen. gabapentin (NEURONTIN) 300 MG capsule Take 300 mg by mouth 2 (two) times daily.    Marland Kitchen. levothyroxine (SYNTHROID, LEVOTHROID) 50 MCG tablet Take 50 mcg by mouth daily before breakfast.     . loperamide (IMODIUM) 2 MG capsule Take 1 capsule (2 mg total) by mouth 4 (four) times daily as needed for diarrhea or loose stools. 12 capsule 0  . losartan (COZAAR) 50 MG tablet Take 50 mg by mouth daily.    . metFORMIN (GLUCOPHAGE) 500 MG tablet Take 500 mg  by mouth 2 (two) times daily with a meal.    . nebivolol (BYSTOLIC) 10 MG tablet Take 10 mg by mouth daily.    Marland Kitchen. omeprazole (PRILOSEC) 20 MG capsule Take 20 mg by mouth 2 (two) times daily before a meal.    . simvastatin (ZOCOR) 40 MG tablet Take 40 mg by mouth daily.    . traZODone (DESYREL) 50 MG tablet Take 100 mg by mouth at bedtime.     No current facility-administered medications for this visit.       ___________________________________________________________________ Objective   Exam:  BP 102/68 (BP Location: Left Arm, Patient Position: Sitting, Cuff Size: Normal)   Pulse 69   Temp 98.3 F (36.8 C) (Oral)   Ht 5\' 4"  (1.626 m)   Wt 158 lb 8 oz (71.9 kg)   SpO2 98%   BMI 27.21 kg/m    General: Well-appearing, somewhat flattened affect  Eyes: sclera anicteric, no redness  ENT: oral mucosa moist without lesions, no cervical or supraclavicular lymphadenopathy  CV: RRR without murmur, S1/S2, no JVD, no peripheral edema  Resp: clear to auscultation bilaterally, normal RR and effort noted  GI: soft, epigastric to right upper quadrant tenderness, with active bowel sounds. No guarding or palpable organomegaly noted.  Lung vertical incision running from right mid right costal margin to right lower quadrant, no hernia.  There is a fullness under that as well.  Skin; warm and dry, no rash or jaundice noted  Neuro: awake, alert and oriented x 3. Normal gross motor function and fluent speech  Labs:  No recent data or primary care records  Prior GI testing through care everywhere as noted above  Assessment: Encounter Diagnoses  Name Primary?  . Generalized abdominal pain Yes  . Chronic diarrhea   . Abnormal loss of weight   . Heartburn     Symptoms are somewhat difficult to characterize, they seem to be very longstanding with prior extensive work-up.  It does not seem she has had testing to rule out exocrine pancreatic insufficiency, and no prior empiric therapy for  bile acid diarrhea.  Unknown complexity of  a cholecystectomy.  She has had prior upper endoscopy, so presumably does not have any degree of upper GI resection or altered biliary/enteric anatomy. Her recent reported weight loss is concerning, unclear if it is accurate or related to these much longer standing digestive symptoms.  Must consider chronic pancreatitis from prior tobacco use.  The previous diagnosis of IBS/functional bowel disorder may be accurate, but the weight loss must be worked up further.  Plan:  Fecal elastase CT abdomen and pelvis with oral and IV contrast.  Check BUN and creatinine today prior to giving IV contrast. Stop dicyclomine since it has not been helpful No new meds at this time, awaiting test results.  Bile acid binding agent may be difficult to manage given her polypharmacy.  Thank you for the courtesy of this consult.  Please call me with any questions or concerns.  Charlie PitterHenry L Danis III  CC: Referring provider noted above

## 2019-03-16 LAB — BUN/CREATININE RATIO
BUN/Creatinine Ratio: 11 (calc) (ref 6–22)
BUN: 12 mg/dL (ref 7–25)
Creat: 1.12 mg/dL — ABNORMAL HIGH (ref 0.50–0.99)
GFR, Est African American: 58 mL/min/{1.73_m2} — ABNORMAL LOW (ref >=60)
GFR, Est Non African American: 50 mL/min/{1.73_m2} — ABNORMAL LOW (ref >=60)

## 2019-03-29 ENCOUNTER — Ambulatory Visit (INDEPENDENT_AMBULATORY_CARE_PROVIDER_SITE_OTHER)
Admission: RE | Admit: 2019-03-29 | Discharge: 2019-03-29 | Disposition: A | Discharge status: Home / Self Care | Payer: Medicare HMO | Source: Ambulatory Visit | Attending: Gastroenterology | Admitting: Gastroenterology

## 2019-03-29 ENCOUNTER — Other Ambulatory Visit: Payer: Self-pay

## 2019-03-29 DIAGNOSIS — R634 Abnormal weight loss: Secondary | ICD-10-CM

## 2019-03-29 DIAGNOSIS — R12 Heartburn: Secondary | ICD-10-CM

## 2019-03-29 DIAGNOSIS — K529 Noninfective gastroenteritis and colitis, unspecified: Secondary | ICD-10-CM | POA: Diagnosis not present

## 2019-03-29 DIAGNOSIS — R1084 Generalized abdominal pain: Secondary | ICD-10-CM

## 2019-03-29 MED ORDER — IOHEXOL 300 MG/ML  SOLN
100.0000 mL | Freq: Once | INTRAMUSCULAR | Status: AC | PRN
  Administered 2019-03-29: 100 mL via INTRAVENOUS

## 2019-04-05 ENCOUNTER — Encounter: Payer: Self-pay | Admitting: *Deleted

## 2019-05-17 ENCOUNTER — Other Ambulatory Visit: Payer: Self-pay | Admitting: Physician Assistant

## 2019-05-17 DIAGNOSIS — Z1231 Encounter for screening mammogram for malignant neoplasm of breast: Secondary | ICD-10-CM

## 2019-07-04 ENCOUNTER — Ambulatory Visit
Admission: RE | Admit: 2019-07-04 | Discharge: 2019-07-04 | Disposition: A | Discharge status: Home / Self Care | Payer: Medicare HMO | Source: Ambulatory Visit | Attending: Physician Assistant | Admitting: Physician Assistant

## 2019-07-04 ENCOUNTER — Other Ambulatory Visit: Payer: Self-pay

## 2019-07-04 DIAGNOSIS — Z1231 Encounter for screening mammogram for malignant neoplasm of breast: Secondary | ICD-10-CM

## 2019-10-29 ENCOUNTER — Ambulatory Visit: Payer: Medicare HMO | Attending: Internal Medicine

## 2019-10-29 ENCOUNTER — Other Ambulatory Visit: Payer: Self-pay

## 2019-10-29 DIAGNOSIS — Z23 Encounter for immunization: Secondary | ICD-10-CM | POA: Insufficient documentation

## 2019-10-29 NOTE — Progress Notes (Signed)
   Covid-19 Vaccination Clinic  Name:  Nakyia Dau    MRN: [id number] DOB: June 14, 1951  10/29/2019  Ms. Swader was observed post Covid-19 immunization for 15 minutes without incidence. She was provided with Vaccine Information Sheet and instruction to access the V-Safe system.   Ms. Amador was instructed to call 911 with any severe reactions post vaccine: Marland Kitchen Difficulty breathing  . Swelling of your face and throat  . A fast heartbeat  . A bad rash all over your body  . Dizziness and weakness    Immunizations Administered    Name Date Dose VIS Date Route   Pfizer COVID-19 Vaccine 10/29/2019 11:11 AM 0.3 mL 08/11/2019 Intramuscular   Manufacturer: [employer], [employer]   Lot: [id number]   NDC: [phone]

## 2019-11-28 ENCOUNTER — Ambulatory Visit: Payer: Medicare HMO | Attending: Internal Medicine

## 2019-11-28 DIAGNOSIS — Z23 Encounter for immunization: Secondary | ICD-10-CM

## 2019-11-28 NOTE — Progress Notes (Signed)
   Covid-19 Vaccination Clinic  Name:  Alejandra Duffy    MRN: [id number] DOB: [birth year]  11/28/2019  [name] was observed post Covid-19 immunization for 15 minutes without incident. She was provided with Vaccine Information Sheet and instruction to access the V-Safe system.   [name] was instructed to call 911 with any severe reactions post vaccine: Marland Kitchen Difficulty breathing  . Swelling of face and throat  . A fast heartbeat  . A bad rash all over body  . Dizziness and weakness   Immunizations Administered    Name Date Dose VIS Date Route   Pfizer COVID-19 Vaccine 11/28/2019 11:18 AM 0.3 mL 08/11/2019 Intramuscular   Manufacturer: [employer], [employer]   Lot: [id number]   NDC: [address]-6

## 2020-05-29 ENCOUNTER — Other Ambulatory Visit: Payer: Self-pay | Admitting: Physician Assistant

## 2020-05-29 DIAGNOSIS — Z1231 Encounter for screening mammogram for malignant neoplasm of breast: Secondary | ICD-10-CM

## 2020-07-11 ENCOUNTER — Ambulatory Visit: Payer: Medicare HMO

## 2020-09-14 ENCOUNTER — Other Ambulatory Visit: Payer: Self-pay | Admitting: Orthopaedic Surgery

## 2020-09-14 DIAGNOSIS — M542 Cervicalgia: Secondary | ICD-10-CM

## 2020-10-04 NOTE — Progress Notes (Deleted)
Patient referred by Shanon Rosser, PA-C for ***  Subjective:   Alejandra Duffy, female    DOB: September 04, 1950, 70 y.o.   MRN: [id number]  *** No chief complaint on file.   *** HPI  70 y.o. African American female with hypertension, hyperlipidemia, type 2 DM, former smoker, referred for ***  ***  Of note, toxicology urine sample on 09/18/2020 showed cocaine positive.***  Past Medical History:  Diagnosis Date  . Arthritis   . Asthma   . Dementia ([place])   . Depression   . Diabetes mellitus    type II  . GERD (gastroesophageal reflux disease)   . Gout   . Hyperlipemia   . Hyperparathyroidism ([place])   . Hypertension   . Hypothyroidism   . IBS (irritable bowel syndrome)    diarrhea  . Thyroid disease     *** Past Surgical History:  Procedure Laterality Date  . CHOLECYSTECTOMY    . COLONOSCOPY  2013   DR Medoff  . LEFT HEART CATH AND CORONARY ANGIOGRAPHY N/A 04/20/2017   Procedure: LEFT HEART CATH AND CORONARY ANGIOGRAPHY;  Surgeon: Charolette Forward, MD;  Location: [place] [facility];  Service: Cardiovascular;  Laterality: N/A;  . TOOTH EXTRACTION Bilateral 05/10/2018   Procedure: DENTAL RESTORATION/EXTRACTIONS;  Surgeon: Diona Browner, DDS;  Location: [place];  Service: Oral Surgery;  Laterality: Bilateral;  . VESICOVAGINAL FISTULA CLOSURE W/ TAH      *** Social History   Tobacco Use  Smoking Status Former Smoker  . Packs/day: 0.30  . Years: 15.00  . Pack years: 4.50  . Types: Cigarettes  . Quit date: 04/01/2011  . Years since quitting: 9.5  Smokeless Tobacco Never Used    Social History   Substance and Sexual Activity  Alcohol Use No    *** Family History  Problem Relation Age of Onset  . Asthma Father   . Allergies Father   . Allergies Brother   . Diabetes Mother   . Breast cancer Neg Hx   . Colon cancer Neg Hx   . Stomach cancer Neg Hx     *** Current Outpatient Medications on File Prior to Visit  Medication Sig Dispense Refill  . albuterol  (PROVENTIL HFA) 108 (90 BASE) MCG/ACT inhaler Inhale 2 puffs into the lungs every 6 (six) hours as needed for wheezing or shortness of breath.     Marland Kitchen amLODipine (NORVASC) 5 MG tablet Take 5 mg by mouth daily.    . citalopram (CELEXA) 40 MG tablet Take 40 mg by mouth daily.    . colchicine 0.6 MG tablet Take 0.6 mg by mouth 2 (two) times daily as needed (gout).     Marland Kitchen dicyclomine (BENTYL) 20 MG tablet Take 20 mg by mouth 2 (two) times daily.    . diphenhydrAMINE (BENADRYL) 25 MG tablet Take 50 mg by mouth daily as needed for allergies.    . diphenhydrAMINE (BENADRYL) 25 MG tablet Take 25 mg by mouth every 6 (six) hours as needed for allergies.    Marland Kitchen gabapentin (NEURONTIN) 300 MG capsule Take 300 mg by mouth 2 (two) times daily.    Marland Kitchen levothyroxine (SYNTHROID, LEVOTHROID) 50 MCG tablet Take 50 mcg by mouth daily before breakfast.     . loperamide (IMODIUM) 2 MG capsule Take 1 capsule (2 mg total) by mouth 4 (four) times daily as needed for diarrhea or loose stools. 12 capsule 0  . losartan (COZAAR) 50 MG tablet Take 50 mg by mouth daily.    Marland Kitchen  metFORMIN (GLUCOPHAGE) 500 MG tablet Take 500 mg by mouth 2 (two) times daily with a meal.    . nebivolol (BYSTOLIC) 10 MG tablet Take 10 mg by mouth daily.    Marland Kitchen omeprazole (PRILOSEC) 20 MG capsule Take 20 mg by mouth 2 (two) times daily before a meal.    . simvastatin (ZOCOR) 40 MG tablet Take 40 mg by mouth daily.    . traZODone (DESYREL) 50 MG tablet Take 100 mg by mouth at bedtime.     No current facility-administered medications on file prior to visit.    Cardiovascular and other pertinent studies:  *** EKG ***/***/202***: ***  *** Recent labs: 09/19/2020: Glucose 107, BUN/Cr 11/0.85. EGFR 80. Na/K 141/4.6. Rest of the CMP normal H/H 12.3/37.3. MCV 86.3. Platelets 214 HbA1C 6.6% Chol 203, TG 108, HDL 45, LDL 136 TSH 0.43 normal   *** ROS      *** There were no vitals filed for this visit.  *** There is no height or weight on file to  calculate BMI. There were no vitals filed for this visit.  *** Objective:   Physical Exam    ***     Assessment & Recommendations:   ***  ***  Thank you for referring the patient to [place]. Please feel free to contact with any questions.   Nigel Mormon, MD Pager: [phone] Office: [phone]

## 2020-10-07 ENCOUNTER — Other Ambulatory Visit: Payer: Medicare HMO

## 2020-10-07 ENCOUNTER — Ambulatory Visit: Payer: Self-pay | Admitting: Cardiology

## 2020-10-18 ENCOUNTER — Other Ambulatory Visit: Payer: Medicare HMO

## 2020-11-27 ENCOUNTER — Telehealth: Payer: Self-pay | Admitting: Internal Medicine

## 2020-11-27 NOTE — Telephone Encounter (Signed)
Entry made in error. Not a patient of [facility]

## 2021-02-18 ENCOUNTER — Other Ambulatory Visit: Payer: Self-pay | Admitting: Orthopaedic Surgery

## 2021-02-18 DIAGNOSIS — M542 Cervicalgia: Secondary | ICD-10-CM

## 2021-02-18 DIAGNOSIS — M5030 Other cervical disc degeneration, unspecified cervical region: Secondary | ICD-10-CM

## 2021-02-24 ENCOUNTER — Ambulatory Visit
Admission: RE | Admit: 2021-02-24 | Discharge: 2021-02-24 | Disposition: A | Discharge status: Home / Self Care | Payer: Medicare HMO | Source: Ambulatory Visit | Attending: Orthopaedic Surgery | Admitting: Orthopaedic Surgery

## 2021-02-24 ENCOUNTER — Other Ambulatory Visit: Payer: Self-pay

## 2021-02-24 DIAGNOSIS — M542 Cervicalgia: Secondary | ICD-10-CM

## 2021-02-24 DIAGNOSIS — M5030 Other cervical disc degeneration, unspecified cervical region: Secondary | ICD-10-CM

## 2021-03-27 ENCOUNTER — Encounter: Payer: Medicare HMO | Attending: Physician Assistant | Admitting: Registered"

## 2021-03-27 ENCOUNTER — Other Ambulatory Visit: Payer: Self-pay

## 2021-03-27 ENCOUNTER — Encounter: Payer: Self-pay | Admitting: Registered"

## 2021-03-27 DIAGNOSIS — E119 Type 2 diabetes mellitus without complications: Secondary | ICD-10-CM | POA: Diagnosis present

## 2021-03-27 NOTE — Patient Instructions (Signed)
Goals: Follow Diabetes Meal Plan as instructed Eat 3 meals and 2 snacks, every 3-5 hrs Have 1/2 plate of non-starchy vegetables, 1/4 plate of starchy vegetables, and 1/4 plate of lean protein with lunch and dinner Aim to have fruit with cheese as snack option. Or: Peanut butter crackers Cheese and crackers Add lean protein foods to meals/snacks Monitor glucose levels as instructed by your doctor Bring food record and glucose log to your next nutrition visit

## 2021-03-27 NOTE — Progress Notes (Signed)
Diabetes Self-Management Education  Visit Type:  First/Initial  Appt. Start Time: 2:02  Appt. End Time: 3:01  03/27/2021  [name], identified by name and date of birth, is a 70 y.o. female with a diagnosis of Diabetes: Type 2.   ASSESSMENT  States her mom had diabetes along with other family members.   States she checks BS 1-2 times/day: FBS (112) and before bed (unsure of numbers during that time).   States she gets dizzy when she stands up therefore she has to be very careful when standing up. States she has been taking her son's metformin sometimes. States she doesn't eat a lot due to lack of appetite. States she likes to drink a lot of fluids. States she craves a lot of salads often. States she likes greens, pasta, and liver. States she likes to eat nuts but doesn't eat them often because she doesn't wear her dentures often. Reports they fit fine but she is not used to wearing them. Reports she can eat chicken, beef, pork chops, and fish because its soft enough for her to eat without wearing dentures. Reports she does the grocery shopping and cooking in her home. States her son lives with her.   Pt expectations: none stated  There were no vitals taken for this visit. There is no height or weight on file to calculate BMI.    Diabetes Self-Management Education - 03/27/21 1413       Health Coping   How would you rate your overall health? Fair      Psychosocial Assessment   Patient Belief/Attitude about Diabetes Motivated to manage diabetes    Self-care barriers None    Self-management support Family    Patient Concerns Nutrition/Meal planning    Special Needs None    Learning Readiness Ready      Complications   Last HgB A1C per patient/outside source 6.9 %    How often do you check your blood sugar? 1-2 times/day    Fasting Blood glucose range (mg/dL) 79-024    Number of hypoglycemic episodes per month 3    Can you tell when your blood sugar is low? Yes    What  do you do if your blood sugar is low? sits down    Number of hyperglycemic episodes per week 7    Can you tell when your blood sugar is high? Yes    What do you do if your blood sugar is high? sits down    Have you had a dilated eye exam in the past 12 months? No    Have you had a dental exam in the past 12 months? No    Are you checking your feet? Yes    How many days per week are you checking your feet? 2      Dietary Intake   Snack (morning) fruit cocktail    Lunch watermelon    Dinner watermelon    Snack (evening) fruit cocktail cup    Beverage(s) 7-up (2*12 oz; 24 oz), apple juice (1*12 oz; 12 oz), water (1*8 oz; 8 oz); coffee (8 oz); 52 oz      Exercise   Exercise Type Light (walking / raking leaves)    How many days per week to you exercise? 1    How many minutes per day do you exercise? 60    Total minutes per week of exercise 60      Patient Education   Previous Diabetes Education No    Disease  state  Definition of diabetes, type 1 and 2, and the diagnosis of diabetes;Factors that contribute to the development of diabetes    Nutrition management  Food label reading, portion sizes and measuring food.;Role of diet in the treatment of diabetes and the relationship between the three main macronutrients and blood glucose level;Meal options for control of blood glucose level and chronic complications.    Monitoring Identified appropriate SMBG and/or A1C goals.;Interpreting lab values - A1C, lipid, urine microalbumina.;Taught/discussed recording of test results and interpretation of SMBG.;Purpose and frequency of SMBG.    Acute complications Taught treatment of hypoglycemia - the 15 rule.;Discussed and identified patients' treatment of hyperglycemia.    Chronic complications Relationship between chronic complications and blood glucose control;Reviewed with patient heart disease, higher risk of, and prevention;Assessed and discussed foot care and prevention of foot problems;Lipid levels,  blood glucose control and heart disease;Identified and discussed with patient  current chronic complications;Retinopathy and reason for yearly dilated eye exams;Nephropathy, what it is, prevention of, the use of ACE, ARB's and early detection of through urine microalbumia.    Psychosocial adjustment Worked with patient to identify barriers to care and solutions      Individualized Goals (developed by patient)   Nutrition Follow meal plan discussed    Medications take my medication as prescribed    Monitoring  test my blood glucose as discussed    Reducing Risk do foot checks daily;examine blood glucose patterns;treat hypoglycemia with 15 grams of carbs if blood glucose less than 70mg /dL      Post-Education Assessment   Patient understands the diabetes disease and treatment process. Demonstrates understanding / competency    Patient understands incorporating nutritional management into lifestyle. Demonstrates understanding / competency    Patient undertands incorporating physical activity into lifestyle. Demonstrates understanding / competency    Patient understands using medications safely. Demonstrates understanding / competency    Patient understands monitoring blood glucose, interpreting and using results Demonstrates understanding / competency    Patient understands prevention, detection, and treatment of acute complications. Demonstrates understanding / competency    Patient understands prevention, detection, and treatment of chronic complications. Demonstrates understanding / competency    Patient understands how to develop strategies to address psychosocial issues. Needs Review    Patient understands how to develop strategies to promote health/change behavior. Demonstrates understanding / competency      Outcomes   Program Status Not Completed             Learning Objective:  Patient will have a greater understanding of diabetes self-management. Patient education plan is to  attend individual and/or group sessions per assessed needs and concerns.   Plan:   Patient Instructions  Goals: Follow Diabetes Meal Plan as instructed Eat 3 meals and 2 snacks, every 3-5 hrs Have 1/2 plate of non-starchy vegetables, 1/4 plate of starchy vegetables, and 1/4 plate of lean protein with lunch and dinner Aim to have fruit with cheese as snack option. Or: Peanut butter crackers Cheese and crackers Add lean protein foods to meals/snacks Monitor glucose levels as instructed by your doctor Bring food record and glucose log to your next nutrition visit    Expected Outcomes:  Demonstrated interest in learning. Expect positive outcomes  Education material provided: ADA - How to Thrive: A Guide for Your Journey with Diabetes  If problems or questions, patient to contact team via:  Phone and Email  Future DSME appointment: - 2 months

## 2021-05-20 ENCOUNTER — Encounter: Payer: Medicare HMO | Attending: Physician Assistant | Admitting: Registered"

## 2021-05-20 ENCOUNTER — Encounter: Payer: Self-pay | Admitting: Registered"

## 2021-05-20 ENCOUNTER — Other Ambulatory Visit: Payer: Self-pay

## 2021-05-20 DIAGNOSIS — E119 Type 2 diabetes mellitus without complications: Secondary | ICD-10-CM | POA: Insufficient documentation

## 2021-05-20 NOTE — Patient Instructions (Addendum)
-   Try water aerobics/ older adult fitness class once a week at [employer].   - Eat 2-3 meals/day.   12 pm - First meal. Can be peanut butter toast  4 pm - Second meal. Can be leftovers or Meals on Wheels.   8 pm - Third meal. Can be steak and gravy, greens, and rice.   - Continue to drink water. Aim for at least 4 bottles of water.

## 2021-05-20 NOTE — Progress Notes (Signed)
Diabetes Self-Management Education  Visit Type:  Follow-up  Appt. Start Time: 11:04 Appt. End Time: 11:46  05/20/2021  Alejandra Duffy, identified by name and date of birth, is a 70 y.o. female with a diagnosis of Diabetes: Type 2.   ASSESSMENT  States she checks her feet. States they were dark brown in color but have cleared up now. Reports her toes ache sometimes.   States she is tired all of the time. States she didn't eat yesterday. Does not have an appetite. States she cooked steak and gravy + rice + mixed greens yesterday for son and herself. States her son ate most of it. States [employer] Whe[employer] brings her lunch daily 10:30-11 am. States she doesn't eat them sometimes; will stack in freezer.   Today's weight is 169.4 lbs  States she has bone issues and fear gaining weight. Reports she was going to the [facility] for a period of time but stopped. States she has [employer] and wants to go to [employer] but does not have transportation. States the nearest [facility] is within walking distance and she is able to walk for water aerobics.   States she wakes up 10 am and goes to bed at 10 pm.   There were no vitals taken for this visit. There is no height or weight on file to calculate BMI.    Diabetes Self-Management Education - 05/20/21 1107       Complications   How often do you check your blood sugar? 0 times/day (not testing)    Number of hypoglycemic episodes per month 0    Number of hyperglycemic episodes per week 0    Have you had a dilated eye exam in the past 12 months? No    Have you had a dental exam in the past 12 months? No    Are you checking your feet? Yes    How many days per week are you checking your feet? 7      Dietary Intake   Breakfast skipped    Lunch skipped    Dinner skipped    Beverage(s) water (3*16 oz; 48 oz)      Exercise   How many days per week to you exercise? 3.5    How many minutes per day do you exercise? 30    Total minutes per week of  exercise 105      Patient Education   Previous Diabetes Education No    Physical activity and exercise  Role of exercise on diabetes management, blood pressure control and cardiac health.      Individualized Goals (developed by patient)   Physical Activity Exercise 1-2 times per week;45 minutes per day;60 minutes per day      Post-Education Assessment   Patient understands how to develop strategies to address psychosocial issues. Demonstrates understanding / competency      Outcomes   Program Status Completed      Subsequent Visit   Since your last visit have you continued or begun to take your medications as prescribed? Not on Medications    Since your last visit have you had your blood pressure checked? No    Since your last visit have you experienced any weight changes? No change    Since your last visit, are you checking your blood glucose at least once a day? No             Learning Objective:  Patient will have a greater understanding of diabetes self-management. Patient education  plan is to attend individual and/or group sessions per assessed needs and concerns.   Plan:   Patient Instructions  - Try water aerobics/ older adult fitness class once a week at local [facility].   - Eat 2-3 meals/day.   12 pm - First meal. Can be peanut butter toast  4 pm - Second meal. Can be leftovers or Meals on Wheels.   8 pm - Third meal. Can be steak and gravy, greens, and rice.   - Continue to drink water. Aim for at least 4 bottles of water.    Expected Outcomes:  Demonstrated interest in learning. Expect positive outcomes  Education material provided: none provided   If problems or questions, patient to contact team via:  Phone and Email  Future DSME appointment: - 4-6 wks

## 2021-06-18 ENCOUNTER — Encounter: Payer: Medicare HMO | Attending: Physician Assistant | Admitting: Registered"

## 2021-06-18 DIAGNOSIS — E119 Type 2 diabetes mellitus without complications: Secondary | ICD-10-CM | POA: Insufficient documentation

## 2021-07-10 ENCOUNTER — Other Ambulatory Visit: Payer: Self-pay | Admitting: Physician Assistant

## 2021-07-10 DIAGNOSIS — Z1231 Encounter for screening mammogram for malignant neoplasm of breast: Secondary | ICD-10-CM

## 2021-08-12 ENCOUNTER — Other Ambulatory Visit: Payer: Self-pay

## 2021-08-12 ENCOUNTER — Ambulatory Visit
Admission: RE | Admit: 2021-08-12 | Discharge: 2021-08-12 | Disposition: A | Discharge status: Home / Self Care | Payer: Medicare HMO | Source: Ambulatory Visit | Attending: Physician Assistant | Admitting: Physician Assistant

## 2021-08-12 DIAGNOSIS — Z1231 Encounter for screening mammogram for malignant neoplasm of breast: Secondary | ICD-10-CM

## 2022-01-08 ENCOUNTER — Ambulatory Visit (INDEPENDENT_AMBULATORY_CARE_PROVIDER_SITE_OTHER): Payer: Medicare HMO

## 2022-01-08 ENCOUNTER — Ambulatory Visit (INDEPENDENT_AMBULATORY_CARE_PROVIDER_SITE_OTHER): Payer: Medicare HMO | Admitting: Podiatry

## 2022-01-08 DIAGNOSIS — M21862 Other specified acquired deformities of left lower leg: Secondary | ICD-10-CM

## 2022-01-08 DIAGNOSIS — M722 Plantar fascial fibromatosis: Secondary | ICD-10-CM

## 2022-01-08 DIAGNOSIS — S9000XA Contusion of unspecified ankle, initial encounter: Secondary | ICD-10-CM

## 2022-01-08 DIAGNOSIS — M7662 Achilles tendinitis, left leg: Secondary | ICD-10-CM | POA: Diagnosis not present

## 2022-01-08 DIAGNOSIS — S9001XA Contusion of right ankle, initial encounter: Secondary | ICD-10-CM | POA: Diagnosis not present

## 2022-01-08 DIAGNOSIS — M21861 Other specified acquired deformities of right lower leg: Secondary | ICD-10-CM

## 2022-01-08 NOTE — Patient Instructions (Signed)
Look for Voltaren gel at the pharmacy over the counter or online (also known as diclofenac 1% gel). Apply to the painful areas 3-4x daily with the supplied dosing card. Allow to dry for 10 minutes before going into socks/shoes ? ? ?Call to schedule physical therapy: ?[facility] ?[address] [place] ? [phone] ? ?Plantar Fasciitis (Heel Spur Syndrome) ?with Rehab ?The plantar fascia is a fibrous, ligament-like, soft-tissue structure that spans the bottom of the foot. Plantar fasciitis is a condition that causes pain in the foot due to inflammation of the tissue. ?SYMPTOMS  ?Pain and tenderness on the underneath side of the foot. ?Pain that worsens with standing or walking. ?CAUSES  ?Plantar fasciitis is caused by irritation and injury to the plantar fascia on the underneath side of the foot. Common mechanisms of injury include: ?Direct trauma to bottom of the foot. ?Damage to a small nerve that runs under the foot where the main fascia attaches to the heel bone. ?Stress placed on the plantar fascia due to bone spurs. ?RISK INCREASES WITH:  ?Activities that place stress on the plantar fascia (running, jumping, pivoting, or cutting). ?Poor strength and flexibility. ?Improperly fitted shoes. ?Tight calf muscles. ?Flat feet. ?Failure to warm-up properly before activity. ?Obesity. ?PREVENTION ?Warm up and stretch properly before activity. ?Allow for adequate recovery between workouts. ?Maintain physical fitness: ?Strength, flexibility, and endurance. ?Cardiovascular fitness. ?Maintain a health body weight. ?Avoid stress on the plantar fascia. ?Wear properly fitted shoes, including arch supports for individuals who have flat feet. ? ?PROGNOSIS  ?If treated properly, then the symptoms of plantar fasciitis usually resolve without surgery. However, occasionally surgery is necessary. ? ?RELATED COMPLICATIONS  ?Recurrent symptoms that may result in a chronic  condition. ?Problems of the lower back that are caused by compensating for the injury, such as limping. ?Pain or weakness of the foot during push-off following surgery. ?Chronic inflammation, scarring, and partial or complete fascia tear, occurring more often from repeated injections. ? ?TREATMENT  ?Treatment initially involves the use of ice and medication to help reduce pain and inflammation. The use of strengthening and stretching exercises may help reduce pain with activity, especially stretches of the Achilles tendon. These exercises may be performed at home or with a therapist. Your caregiver may recommend that you use heel cups of arch supports to help reduce stress on the plantar fascia. Occasionally, corticosteroid injections are given to reduce inflammation. If symptoms persist for greater than 6 months despite non-surgical (conservative), then surgery may be recommended.  ? ?MEDICATION  ?If pain medication is necessary, then nonsteroidal anti-inflammatory medications, such as aspirin and ibuprofen, or other minor pain relievers, such as acetaminophen, are often recommended. ?Do not take pain medication within 7 days before surgery. ?Prescription pain relievers may be given if deemed necessary by your caregiver. Use only as directed and only as much as you need. ?Corticosteroid injections may be given by your caregiver. These injections should be reserved for the most serious cases, because they may only be given a certain number of times. ? ?HEAT AND COLD ?Cold treatment (icing) relieves pain and reduces inflammation. Cold treatment should be applied for 10 to 15 minutes every 2 to 3 hours for inflammation and pain and immediately after any activity that aggravates your symptoms. Use ice packs or massage the area with a piece of ice (ice massage). ?Heat treatment may be used prior to performing the stretching and strengthening activities prescribed by your caregiver, physical therapist,  or Pensions consultant. Use a heat pack or soak the injury in warm water. ? ?SEEK IMMEDIATE MEDICAL CARE IF: ?Treatment seems to offer no benefit, or the condition worsens. ?Any medications produce adverse side effects. ? ?EXERCISES- ?RANGE OF MOTION (ROM) AND STRETCHING EXERCISES - Plantar Fasciitis (Heel Spur Syndrome) ?These exercises may help you when beginning to rehabilitate your injury. Your symptoms may resolve with or without further involvement from your physician, physical therapist or athletic trainer. While completing these exercises, remember:  ?Restoring tissue flexibility helps normal motion to return to the joints. This allows healthier, less painful movement and activity. ?An effective stretch should be held for at least 30 seconds. ?A stretch should never be painful. You should only feel a gentle lengthening or release in the stretched tissue. ? ?RANGE OF MOTION - Toe Extension, Flexion ?Sit with your right / left leg crossed over your opposite knee. ?Grasp your toes and gently pull them back toward the top of your foot. You should feel a stretch on the bottom of your toes and/or foot. ?Hold this stretch for 10 seconds. ?Now, gently pull your toes toward the bottom of your foot. You should feel a stretch on the top of your toes and or foot. ?Hold this stretch for 10 seconds. ?Repeat  times. Complete this stretch 3 times per day.  ? ?RANGE OF MOTION - Ankle Dorsiflexion, Active Assisted ?Remove shoes and sit on a chair that is preferably not on a carpeted surface. ?Place right / left foot under knee. Extend your opposite leg for support. ?Keeping your heel down, slide your right / left foot back toward the chair until you feel a stretch at your ankle or calf. If you do not feel a stretch, slide your bottom forward to the edge of the chair, while still keeping your heel down. ?Hold this stretch for 10 seconds. ?Repeat 3 times. Complete this stretch 2 times per day.  ? ?STRETCH  Gastroc, Standing ?Place hands on  wall. ?Extend right / left leg, keeping the front knee somewhat bent. ?Slightly point your toes inward on your back foot. ?Keeping your right / left heel on the floor and your knee straight, shift your weight toward the wall, not allowing your back to arch. ?You should feel a gentle stretch in the right / left calf. Hold this position for 10 seconds. ?Repeat 3 times. Complete this stretch 2 times per day. ? ?STRETCH  Soleus, Standing ?Place hands on wall. ?Extend right / left leg, keeping the other knee somewhat bent. ?Slightly point your toes inward on your back foot. ?Keep your right / left heel on the floor, bend your back knee, and slightly shift your weight over the back leg so that you feel a gentle stretch deep in your back calf. ?Hold this position for 10 seconds. ?Repeat 3 times. Complete this stretch 2 times per day. ? ?Mirant, Standing  ?Note: This exercise can place a lot of stress on your foot and ankle. Please complete this exercise only if specifically instructed by your caregiver.  ?Place the ball of your right / left foot on a step, keeping your other foot firmly on the same step. ?Hold on to the wall or a rail for balance. ?Slowly lift your other foot, allowing your body weight to press your heel down over the edge of the step. ?You should feel a stretch in your right / left calf. ?Hold this position for 10 seconds. ?Repeat this exercise with a slight bend  in your right / left knee. ?Repeat 3 times. Complete this stretch 2 times per day.  ? ?STRENGTHENING EXERCISES - Plantar Fasciitis (Heel Spur Syndrome)  ?These exercises may help you when beginning to rehabilitate your injury. They may resolve your symptoms with or without further involvement from your physician, physical therapist or athletic trainer. While completing these exercises, remember:  ?Muscles can gain both the endurance and the strength needed for everyday activities through controlled exercises. ?Complete these  exercises as instructed by your physician, physical therapist or athletic trainer. Progress the resistance and repetitions only as guided. ? ?STRENGTH - Towel Curls ?Sit in a chair positioned on a non-carpeted surface.

## 2022-01-11 ENCOUNTER — Encounter: Payer: Self-pay | Admitting: Podiatry

## 2022-01-11 NOTE — Progress Notes (Signed)
?  Subjective:  ?Patient ID: Alejandra Duffy, female    DOB: 02-Sep-1950,  MRN: [id number] ? ?Chief Complaint  ?Patient presents with  ? Foot Pain  ?  Plantar fascitis  ? ? ?71 y.o. female presents with the above complaint. History confirmed with patient.  She has pain in the right plantar heel and left posterior heel.  It radiates down to the toes and feels achy.  The Achilles has been going on about a month the plantar fasciitis has been going on about 2 months.  Has not had much treatment thus far other than over-the-counter anti-inflammatories. ? ?Objective:  ?Physical Exam: ?warm, good capillary refill, no trophic changes or ulcerative lesions, normal DP and PT pulses, and normal sensory exam. ?Left Foot: tenderness at Achilles tendon insertion and gastrocnemius equinus is noted with a positive silverskiold test ?Right Foot: point tenderness over the heel pad and gastrocnemius equinus is noted with a positive silverskiold test ? ?No images are attached to the encounter. ? ?Radiographs: ?Multiple views x-ray of both feet: no fracture, dislocation, swelling or degenerative changes noted, plantar calcaneal spur, and posterior calcaneal spur ?Assessment:  ? ?1. Tendonitis, Achilles, left   ?2. Plantar fasciitis, right   ?3. Gastrocnemius equinus of left lower extremity   ?4. Gastrocnemius equinus of right lower extremity   ?5. Plantar fasciitis   ? ? ? ?Plan:  ?Patient was evaluated and treated and all questions answered. ? ?Discussed the etiology and treatment options for plantar fasciitis and Achilles tendinitis including stretching, formal physical therapy, supportive shoegears such as a running shoe or sneaker, pre fabricated orthoses, injection therapy, and oral medications. We also discussed the role of surgical treatment of this for patients who do not improve after exhausting non-surgical treatment options. ? ? ?-XR reviewed with patient ?-Educated patient on stretching and icing of the affected  limb ?-Injection delivered to the plantar fascia of the right foot. ?-Rx for medrol pack. Educated on use, risks, and benefits of the medication ?-Referral sent to [facility] physical therapy ? ?After sterile prep with povidone-iodine solution and alcohol, the right heel was injected with 0.5cc 2% xylocaine plain, 0.5cc 0.5% marcaine plain, 5mg  triamcinolone acetonide, and 2mg  dexamethasone was injected along the medial plantar fascia at the insertion on the plantar calcaneus. The patient tolerated the procedure well without complication. ? ?Return in about 6 weeks (around 02/19/2022) for recheck plantar fasciitis, re-check Achilles tendon.  ? ?

## 2022-02-16 ENCOUNTER — Ambulatory Visit (INDEPENDENT_AMBULATORY_CARE_PROVIDER_SITE_OTHER): Payer: Medicare HMO | Admitting: Podiatry

## 2022-02-16 DIAGNOSIS — M722 Plantar fascial fibromatosis: Secondary | ICD-10-CM

## 2022-02-16 DIAGNOSIS — M7662 Achilles tendinitis, left leg: Secondary | ICD-10-CM

## 2022-02-16 MED ORDER — METHYLPREDNISOLONE 4 MG PO TBPK: ORAL_TABLET | ORAL | 0 refills | Status: DC

## 2022-02-16 NOTE — Patient Instructions (Signed)
Call to schedule physical therapy: [facility] at [place] [address]  [phone]  

## 2022-02-17 NOTE — Progress Notes (Signed)
  Subjective:  Patient ID: Scarlette Shorts, female    DOB: [birth year],  MRN: [id number]  Chief Complaint  Patient presents with   Tendonitis    6 wks for recheck plantar fasciitis, re-check Achilles tendon.    71 y.o. female presents with the above complaint. History confirmed with patient.  She has pain in the right plantar heel and left posterior heel.  It radiates down to the toes and feels achy.  The Achilles has been going on about a month the plantar fasciitis has been going on about 2 months.  Has not had much treatment thus far other than over-the-counter anti-inflammatories.  Interval history: The right foot plantar heel is still quite painful.  The Achilles tendon on the left foot is doing much better  Objective:  Physical Exam: warm, good capillary refill, no trophic changes or ulcerative lesions, normal DP and PT pulses, and normal sensory exam. Left Foot: No pain in the Achilles today Right Foot: point tenderness over the heel pad and gastrocnemius equinus is noted with a positive silverskiold test  No images are attached to the encounter.  Radiographs: Multiple views x-ray of both feet: no fracture, dislocation, swelling or degenerative changes noted, plantar calcaneal spur, and posterior calcaneal spur Assessment:   1. Tendonitis, Achilles, left   2. Plantar fasciitis, right      Plan:  Patient was evaluated and treated and all questions answered.  Discussed the etiology and treatment options for plantar fasciitis and Achilles tendinitis including stretching, formal physical therapy, supportive shoegears such as a running shoe or sneaker, pre fabricated orthoses, injection therapy, and oral medications. We also discussed the role of surgical treatment of this for patients who do not improve after exhausting non-surgical treatment options.   -Recommend she continue her home exercise program -She did not start physical therapy after last visit she will call to  schedule the referral appears to be still active -Repeat methylprednisolone taper sent to pharmacy -If not improving again by next visit would recommend repeat injection   Return in about 6 weeks (around 03/30/2022) for recheck plantar fasciitis.

## 2022-03-30 ENCOUNTER — Ambulatory Visit (INDEPENDENT_AMBULATORY_CARE_PROVIDER_SITE_OTHER): Payer: Medicare HMO | Admitting: Podiatry

## 2022-03-30 DIAGNOSIS — M722 Plantar fascial fibromatosis: Secondary | ICD-10-CM | POA: Diagnosis not present

## 2022-03-30 NOTE — Progress Notes (Signed)
  Subjective:  Patient ID: Alejandra Duffy, female    DOB: 03-07-1951,  MRN: [id number]  Chief Complaint  Patient presents with   Follow-up    6 wks for recheck plantar fasciitis, re-check Achilles tendon- patient stated she is having some pain but it is better than last visit. Patient interested in a brace.     71 y.o. female presents with the above complaint. History confirmed with patient.  She has pain in the right plantar heel and left posterior heel.  It radiates down to the toes and feels achy.  The Achilles has been going on about a month the plantar fasciitis has been going on about 2 months.  Has not had much treatment thus far other than over-the-counter anti-inflammatories.  Interval history: The right foot plantar heel is improving but still painful.  She has not done physical therapy  Objective:  Physical Exam: warm, good capillary refill, no trophic changes or ulcerative lesions, normal DP and PT pulses, and normal sensory exam. Left Foot: No pain in the Achilles today Right Foot: point tenderness over the heel pad and gastrocnemius equinus is noted with a positive silverskiold test  No images are attached to the encounter.  Radiographs: Multiple views x-ray of both feet: no fracture, dislocation, swelling or degenerative changes noted, plantar calcaneal spur, and posterior calcaneal spur Assessment:   1. Plantar fasciitis, right      Plan:  Patient was evaluated and treated and all questions answered.  Discussed the etiology and treatment options for plantar fasciitis and Achilles tendinitis including stretching, formal physical therapy, supportive shoegears such as a running shoe or sneaker, pre fabricated orthoses, injection therapy, and oral medications. We also discussed the role of surgical treatment of this for patients who do not improve after exhausting non-surgical treatment options.   -Recommend she continue physical therapy and a new referral was  sent.  She will call to schedule -Recommended plantar fascia brace to support the medial longitudinal arch and offload the plantar fascia -Repeat injection at next visit if not improving   Return in about 8 weeks (around 05/25/2022) for recheck plantar fasciitis.

## 2022-03-30 NOTE — Patient Instructions (Signed)
Call to schedule physical therapy: [facility] [place] [address]  [phone]  

## 2022-04-13 ENCOUNTER — Ambulatory Visit: Payer: Medicare HMO

## 2022-04-14 NOTE — Therapy (Addendum)
OUTPATIENT PHYSICAL THERAPY LOWER EXTREMITY EVALUATION   Patient Name: Alejandra Duffy MRN: [id number] DOB:01-17-1951, 71 y.o., female Today's Date: 04/16/2022   PT End of Session - 04/16/22 1517     Visit Number 1    Number of Visits 8    Authorization Type [employer] MEDIC[employer] HMO    PT Start Time 1415    PT Stop Time 1500    PT Time Calculation (min) 45 min    Activity Tolerance Patient tolerated treatment well    Behavior During Therapy WFL for tasks assessed/performed             Past Medical History:  Diagnosis Date   Arthritis    Asthma    Dementia (HCC)    Depression    Diabetes mellitus    type II   GERD (gastroesophageal reflux disease)    Gout    Hyperlipemia    Hyperparathyroidism (HCC)    Hypertension    Hypothyroidism    IBS (irritable bowel syndrome)    diarrhea   Thyroid disease    Past Surgical History:  Procedure Laterality Date   CHOLECYSTECTOMY     COLONOSCOPY  2013   DR Medoff   LEFT HEART CATH AND CORONARY ANGIOGRAPHY N/A 04/20/2017   Procedure: LEFT HEART CATH AND CORONARY ANGIOGRAPHY;  Surgeon: Rinaldo Cloud, MD;  Location: [facility];  Service: Cardiovascular;  Laterality: N/A;   TOOTH EXTRACTION Bilateral 05/10/2018   Procedure: DENTAL RESTORATION/EXTRACTIONS;  Surgeon: Ocie Doyne, DDS;  [facility]: [facility];  Service: Oral Surgery;  Laterality: Bilateral;   VESICOVAGINAL FISTULA CLOSURE W/ TAH     Patient Active Problem List   Diagnosis Date Noted   Dyspnea 05/04/2012   Cough 04/04/2012   Hypertension 04/04/2012    PCP: Lindaann Pascal, PA-C  REFERRING PROVIDER: Edwin Cap, DPM  REFERRING DIAG: Plantar fasciitis, right [M72.2]  THERAPY DIAG:  Pain in right foot - Plan: PT plan of care cert/re-cert  Muscle weakness (generalized) - Plan: PT plan of care cert/re-cert  Other abnormalities of gait and mobility - Plan: PT plan of care cert/re-cert  Rationale for Evaluation and Treatment Rehabilitation  ONSET DATE:  4 months  SUBJECTIVE:   SUBJECTIVE STATEMENT: 71 y.o. female presents with sudden onset of pain in her R heel that progressively got worse over time. She denies any radiating pain and reports isolation to heel.   PERTINENT HISTORY: Arthritis, Asthma, Depression, Dementia, DM, GERD, Gout, HTN, IBS  PAIN:  Are you having pain? Yes: NPRS scale: 8/10 Pain location: L heel.  Pain description: Stabbing pain, intermittent  Aggravating factors: Walking, standing, squatting, stairs.  Relieving factors: Tropical lotion,   PRECAUTIONS: None  WEIGHT BEARING RESTRICTIONS No  FALLS:  Has patient fallen in last 6 months? No  LIVING ENVIRONMENT: Lives with: lives with their son Lives in: House/apartment Stairs: No Has following equipment at home: None  OCCUPATION: Retired  PLOF: Independent  PATIENT GOALS PT would like to decrease her pain.    OBJECTIVE:   DIAGNOSTIC FINDINGS: Multiple views x-ray of both feet: no fracture, dislocation, swelling or degenerative changes noted, plantar calcaneal spur, and posterior calcaneal spur  PATIENT SURVEYS:  FOTO 67.0419, 73%  COGNITION:  Overall cognitive status: Within functional limits for tasks assessed     SENSATION: WFL   POSTURE: rounded shoulders and forward head  PALPATION: Isolated to middle of heel.   LOWER EXTREMITY ROM:  Active ROM Right eval Left eval  Ankle dorsiflexion [facility]  Ankle plantarflexion  [facility] WFL  Ankle inversion [facility] WFL  Ankle eversion WFL WFL   (Blank rows = not tested)  LOWER EXTREMITY MMT:  3+/5 with DF, PF, IV, EV  ** Pt has difficulty with following instructions despite verbal, visual and  tactile cues for MMT   FUNCTIONAL TESTS:  SLS:  R: 3 sec L:5 sec  GAIT: Distance walked: 50ft Assistive device utilized: None Level of assistance: Complete Independence Comments: Pt walks with antalgic gait with decreased stance time on R LE.     TODAY'S TREATMENT: Creating, reviewing, and  completing below HEP   PATIENT EDUCATION:  Education details: Educated pt on anatomy and physiology of current symptoms, FOTO, diagnosis, prognosis, HEP,  and POC. Person educated: Patient Education method: Medical illustrator Education comprehension: verbalized understanding and returned demonstration   HOME EXERCISE PROGRAM: Access Code: [email or web address] URL: [email or web address] Date: 04/16/2022 Prepared by: Royal Hawthorn  Exercises - Seated Ankle Circles  - 2 x daily - 7 x weekly - 3 sets - 10 reps - Seated Plantar Fascia Stretch  - 2 x daily - 7 x weekly - 2 sets - 2 reps - 30 hold - Seated Plantar Fascia Ice massage   ASSESSMENT:  CLINICAL IMPRESSION: Patient is a 71 y.o. F who was seen today for physical therapy evaluation and treatment for R Plantar fasciitis. Pt with isolated tenderness to heel with palpation. She walks with an antalgic gait pattern with less weight on R LE. She has full functional ROM, but has difficulty performing ankle circles. Pt with decreased balance bilaterally.    OBJECTIVE IMPAIRMENTS Abnormal gait, decreased activity tolerance, decreased balance, decreased endurance, decreased knowledge of condition, decreased mobility, difficulty walking, decreased ROM, decreased strength, and pain.   ACTIVITY LIMITATIONS lifting, standing, squatting, stairs, and locomotion level  PARTICIPATION LIMITATIONS: meal prep, cleaning, laundry, shopping, and community activity  PERSONAL FACTORS Age, Education, Past/current experiences, Time since onset of injury/illness/exacerbation, and 3+ comorbidities: Arthritis, Asthma, Depression, Dementia, DM, GERD, Gout, HTN, IBS  are also affecting patient's functional outcome.   REHAB POTENTIAL: Good  CLINICAL DECISION MAKING: Stable/uncomplicated  EVALUATION COMPLEXITY: Low   GOALS: Goals reviewed with patient? No  SHORT TERM GOALS: Target date: 05/14/2022   Pt will be I and compliant with initial  HEP. Baseline:  Goal status: INITIAL  2.  Pt will report </=6/10 pain at baseline  Baseline: 8/10 Goal status: INITIAL   LONG TERM GOALS: Target date: 06/11/2022   Pt will be independent with advanced HEP to continue to address postural limitations and muscle imbalances. Baseline: not provided Goal status: INITIAL  2.  Pt will report </=210 pain at baseline  Baseline:  Goal status: INITIAL  3.  Pt will improve FOTO function score to no less than 73% as proxy for functional improvement Baseline:  Goal status: INITIAL  4.  Pt will improve SLS  Baseline:  Goal status: INITIAL  5.  Pt will improve MMT to 4-/5 globally.  Baseline: 3+/5 Goal status: INITIAL  PLAN: PT FREQUENCY: 1x/week  PT DURATION: 8 weeks  PLANNED INTERVENTIONS: Therapeutic exercises, Therapeutic activity, Neuromuscular re-education, Balance training, Gait training, Patient/Family education, Self Care, Joint mobilization, Joint manipulation, Stair training, Aquatic Therapy, Dry Needling, Electrical stimulation, Cryotherapy, Moist heat, Taping, Vasopneumatic device, Traction, Ultrasound, Ionotophoresis 4mg /ml Dexamethasone, Manual therapy, and Re-evaluation  PLAN FOR NEXT SESSION: Assess HEP/update PRN, continue to progress functional mobility, strengthen LE muscles. Decrease patients pain and help minimize functional deficits, gait training and balance.   , PT 04/16/2022, 3:17  PM    Referring diagnosis? Plantar fasciitis, right [M72.2] Treatment diagnosis? (if different than referring diagnosis) M79.671 What was this (referring dx) caused by? []  Surgery []  Fall [x]  Ongoing issue []  Arthritis []  Other: ____________  Laterality: [x]  Rt []  Lt []  Both  Check all possible CPT codes:  *CHOOSE 10 OR LESS*    []  97110 (Therapeutic Exercise)  []  92507 (SLP Treatment)  []  (Neuro Re-ed)   []  92526 (Swallowing Treatment)   []  97116 (Gait Training)   []  (Cognitive Training, 1st 15  minutes) []  97140 (Manual Therapy)   []  97130 (Cognitive Training, each add'l 15 minutes)  []  97164 (Re-evaluation)                              []  Other, List CPT Code ____________  []  97530 (Therapeutic Activities)     []  97535 (Self Care)   [x]  All codes above (97110 - 97535)  []  97012 (Mechanical Traction)  [x]  97014 (E-stim Unattended)  [x]  97032 (E-stim manual)  [x]  97033 (Ionto)  [x]  97035 (Ultrasound) [x]  97750 (Physical Performance Training) [x]  (Aquatic Therapy) []  97016 (Vasopneumatic Device) []  (Paraffin) []  97034 (Contrast Bath) []  97597 (Wound Care 1st 20 sq cm) []  97598 (Wound Care each add'l 20 sq cm) []  97760 (Orthotic Fabrication, Fitting, Training Initial) []  (Prosthetic Management and Training Initial) []  (Orthotic or Prosthetic Training/ Modification Subsequent)   Kristoffer Leamon PT, DPT, LAT, ATC  04/23/22  5:12 PM

## 2022-04-16 ENCOUNTER — Ambulatory Visit: Payer: Medicare HMO | Attending: Podiatry | Admitting: Physical Therapy

## 2022-04-16 ENCOUNTER — Encounter: Payer: Self-pay | Admitting: Physical Therapy

## 2022-04-16 ENCOUNTER — Other Ambulatory Visit: Payer: Self-pay

## 2022-04-16 DIAGNOSIS — R2689 Other abnormalities of gait and mobility: Secondary | ICD-10-CM | POA: Diagnosis present

## 2022-04-16 DIAGNOSIS — M722 Plantar fascial fibromatosis: Secondary | ICD-10-CM | POA: Diagnosis not present

## 2022-04-16 DIAGNOSIS — M6281 Muscle weakness (generalized): Secondary | ICD-10-CM | POA: Insufficient documentation

## 2022-04-16 DIAGNOSIS — M79671 Pain in right foot: Secondary | ICD-10-CM | POA: Insufficient documentation

## 2022-04-23 ENCOUNTER — Ambulatory Visit: Payer: Medicare HMO

## 2022-04-23 ENCOUNTER — Telehealth: Payer: Self-pay

## 2022-04-23 NOTE — Telephone Encounter (Signed)
Attempted to call patient regarding missed visit, VM not set up.  1st no-show  Alejandra Duffy, [place] 04/23/22 3:54 PM

## 2022-04-30 ENCOUNTER — Ambulatory Visit: Payer: Medicare HMO

## 2022-05-06 ENCOUNTER — Ambulatory Visit: Payer: Medicare HMO | Attending: Podiatry

## 2022-05-06 DIAGNOSIS — R2689 Other abnormalities of gait and mobility: Secondary | ICD-10-CM

## 2022-05-06 DIAGNOSIS — M79671 Pain in right foot: Secondary | ICD-10-CM | POA: Diagnosis present

## 2022-05-06 DIAGNOSIS — M6281 Muscle weakness (generalized): Secondary | ICD-10-CM | POA: Diagnosis present

## 2022-05-06 NOTE — Therapy (Addendum)
OUTPATIENT PHYSICAL THERAPY TREATMENT NOTE/DISCHARGE  PHYSICAL THERAPY DISCHARGE SUMMARY  Visits from Start of Care: 2  Current functional level related to goals / functional outcomes: Unable to assess   Remaining deficits: Unable to assess   Education / Equipment: HEP   Patient agrees to discharge. Patient goals were  Unable to assess . Patient is being discharged due to not returning since the last visit.   Patient Name: Alejandra Duffy MRN: [id number] DOB:[birth year], 71 y.o., female Today's Date: 05/06/2022  PCP: Lindaann Pascal, PA-C  REFERRING PROVIDER: Edwin Cap, DPM  END OF SESSION:   PT End of Session - 05/06/22 1128     Visit Number 2    Number of Visits 8    Authorization Type [employer] MEDIC[employer] HMO    PT Start Time 1128    PT Stop Time 1206    PT Time Calculation (min) 38 min    Activity Tolerance Patient tolerated treatment well    Behavior During Therapy WFL for tasks assessed/performed             Past Medical History:  Diagnosis Date   Arthritis    Asthma    Dementia (HCC)    Depression    Diabetes mellitus    type II   GERD (gastroesophageal reflux disease)    Gout    Hyperlipemia    Hyperparathyroidism (HCC)    Hypertension    Hypothyroidism    IBS (irritable bowel syndrome)    diarrhea   Thyroid disease    Past Surgical History:  Procedure Laterality Date   CHOLECYSTECTOMY     COLONOSCOPY  2013   DR Medoff   LEFT HEART CATH AND CORONARY ANGIOGRAPHY N/A 04/20/2017   Procedure: LEFT HEART CATH AND CORONARY ANGIOGRAPHY;  Surgeon: Rinaldo Cloud, MD;  Location: [facility];  Service: Cardiovascular;  Laterality: N/A;   TOOTH EXTRACTION Bilateral 05/10/2018   Procedure: DENTAL RESTORATION/EXTRACTIONS;  Surgeon: Ocie Doyne, DDS;  Location[facility];  Service: Oral Surgery;  Laterality: Bilateral;   VESICOVAGINAL FISTULA CLOSURE W/ TAH     Patient Active Problem List   Diagnosis Date Noted   Dyspnea 05/04/2012   Cough  04/04/2012   Hypertension 04/04/2012    REFERRING DIAG: Plantar fasciitis, right [M72.2]  THERAPY DIAG:  Pain in right foot  Muscle weakness (generalized)  Other abnormalities of gait and mobility  Rationale for Evaluation and Treatment Rehabilitation  PERTINENT HISTORY: Arthritis, Asthma, Depression, Dementia, DM, GERD, Gout, HTN, IBS  PRECAUTIONS: None  SUBJECTIVE:  Pt presents to PT with continued reports of R foot/heel pain. Has been compliant with her HEP with no adverse effect. Pt is ready to begin PT at this time.   PAIN:  Are you having pain? Yes: NPRS scale: 8/10 Pain location: R heel Pain description: Stabbing pain, intermittent  Aggravating factors: Walking, standing, squatting, stairs.  Relieving factors: Tropical lotion,    OBJECTIVE: (objective measures completed at initial evaluation unless otherwise dated)  DIAGNOSTIC FINDINGS: Multiple views x-ray of both feet: no fracture, dislocation, swelling or degenerative changes noted, plantar calcaneal spur, and posterior calcaneal spur   PATIENT SURVEYS:  FOTO 67.0419, 73%   COGNITION:           Overall cognitive status: Within functional limits for tasks assessed                          SENSATION: WFL     POSTURE: rounded shoulders and forward head  PALPATION: Isolated to middle of heel.    LOWER EXTREMITY ROM:   Active ROM Right eval Left eval  Ankle dorsiflexion [facility]  Ankle plantarflexion [facility] WFL  Ankle inversion [facility]  Ankle eversion WFL WFL   (Blank rows = not tested)   LOWER EXTREMITY MMT:   3+/5 with DF, PF, IV, EV   ** Pt has difficulty with following instructions despite verbal, visual and  tactile cues for MMT    FUNCTIONAL TESTS:  SLS:  R: 3 sec L:5 sec   GAIT: Distance walked: 80ft Assistive device utilized: None Level of assistance: Complete Independence Comments: Pt walks with antalgic gait with decreased stance time on R LE.        TODAY'S TREATMENT: [facility]  Adult PT Treatment:                                                DATE: 05/06/2022 Therapeutic Exercise: NuStep LE only lvl 4 x 4 min while taking subjective Towel scrunch 2x30" each foot Towel calf stretch x 30" each Great toe ext stretch x 30" each Standing calf stretch x 30" each Standing heel raise 2x10 Seated heel raise 2x10 Manual Therapy: IASTM to R plantar fascia  Manual calf stretch R    PATIENT EDUCATION:  Education details: Educated pt on anatomy and physiology of current symptoms, FOTO, diagnosis, prognosis, HEP,  and POC. Person educated: Patient Education method: Medical illustrator Education comprehension: verbalized understanding and returned demonstration     HOME EXERCISE PROGRAM: Access Code: [email or web address] URL: [email or web address] Date: 05/06/2022 Prepared by: Edwinna Areola  Exercises - Seated Ankle Circles  - 2 x daily - 7 x weekly - 3 sets - 10 reps - Seated Plantar Fascia Stretch  - 2 x daily - 7 x weekly - 2 sets - 2 reps - 30 hold - Seated Plantar Fascia Mobilization with Small Ball  - 1 x daily - 7 x weekly - 1 sets - 1 reps - Long Sitting Calf Stretch with Strap  - 2 x daily - 7 x weekly - 2-3 reps - 30 sec hold - Gastroc Stretch on Wall  - 2 x daily - 7 x weekly - 2-3 sets - 30 sec hold - Seated Toe Towel Scrunches  - 1 x daily - 7 x weekly - 2-3 sets - 30 sec hold   ASSESSMENT:   CLINICAL IMPRESSION: Pt was able to complete prescribed exercises with no adverse effect. Responded well to manual therapy interventions, noting decrease in pain and discomfort. HEP updated for continued stretching and strengthening at home. Will continue to progress as able per POC.      OBJECTIVE IMPAIRMENTS Abnormal gait, decreased activity tolerance, decreased balance, decreased endurance, decreased knowledge of condition, decreased mobility, difficulty walking, decreased ROM, decreased strength, and pain.    ACTIVITY LIMITATIONS lifting, standing,  squatting, stairs, and locomotion level   PARTICIPATION LIMITATIONS: meal prep, cleaning, laundry, shopping, and community activity   PERSONAL FACTORS Age, Education, Past/current experiences, Time since onset of injury/illness/exacerbation, and 3+ comorbidities: Arthritis, Asthma, Depression, Dementia, DM, GERD, Gout, HTN, IBS  are also affecting patient's functional outcome.      GOALS: Goals reviewed with patient? No   SHORT TERM GOALS: Target date: 05/14/2022    Pt will be I and compliant with initial HEP. Baseline:  Goal status: INITIAL  2.  Pt will report </=6/10 pain at baseline  Baseline: 8/10 Goal status: INITIAL     LONG TERM GOALS: Target date: 06/11/2022    Pt will be independent with advanced HEP to continue to address postural limitations and muscle imbalances. Baseline: not provided Goal status: INITIAL   2.  Pt will report </=210 pain at baseline  Baseline:  Goal status: INITIAL   3.  Pt will improve FOTO function score to no less than 73% as proxy for functional improvement Baseline:  Goal status: INITIAL   4.  Pt will improve SLS  Baseline:  Goal status: INITIAL   5.  Pt will improve MMT to 4-/5 globally.  Baseline: 3+/5 Goal status: INITIAL   PLAN: PT FREQUENCY: 1x/week   PT DURATION: 8 weeks   PLANNED INTERVENTIONS: Therapeutic exercises, Therapeutic activity, Neuromuscular re-education, Balance training, Gait training, Patient/Family education, Self Care, Joint mobilization, Joint manipulation, Stair training, Aquatic Therapy, Dry Needling, Electrical stimulation, Cryotherapy, Moist heat, Taping, Vasopneumatic device, Traction, Ultrasound, Ionotophoresis 4mg /ml Dexamethasone, Manual therapy, and Re-evaluation   PLAN FOR NEXT SESSION: Assess HEP/update PRN, continue to progress functional mobility, strengthen LE muscles. Decrease patients pain and help minimize functional deficits, gait training and balance.    , PT 05/06/2022,  12:14 PM

## 2022-05-07 ENCOUNTER — Ambulatory Visit: Payer: Medicare HMO

## 2022-05-14 ENCOUNTER — Ambulatory Visit: Payer: Medicare HMO

## 2022-05-18 ENCOUNTER — Ambulatory Visit: Payer: Medicare HMO

## 2022-07-03 ENCOUNTER — Other Ambulatory Visit: Payer: Self-pay | Admitting: Physician Assistant

## 2022-07-03 DIAGNOSIS — Z1231 Encounter for screening mammogram for malignant neoplasm of breast: Secondary | ICD-10-CM

## 2022-07-30 ENCOUNTER — Encounter: Payer: Self-pay | Admitting: Physician Assistant

## 2022-09-03 ENCOUNTER — Ambulatory Visit
Admission: RE | Admit: 2022-09-03 | Discharge: 2022-09-03 | Disposition: A | Discharge status: Home / Self Care | Payer: Medicare HMO | Source: Ambulatory Visit | Attending: Physician Assistant | Admitting: Physician Assistant

## 2022-09-03 DIAGNOSIS — Z1231 Encounter for screening mammogram for malignant neoplasm of breast: Secondary | ICD-10-CM

## 2022-09-07 ENCOUNTER — Other Ambulatory Visit: Payer: Self-pay | Admitting: Physician Assistant

## 2022-09-07 DIAGNOSIS — R928 Other abnormal and inconclusive findings on diagnostic imaging of breast: Secondary | ICD-10-CM

## 2022-09-22 ENCOUNTER — Ambulatory Visit: Payer: Medicare HMO

## 2022-09-22 ENCOUNTER — Ambulatory Visit
Admission: RE | Admit: 2022-09-22 | Discharge: 2022-09-22 | Disposition: A | Discharge status: Home / Self Care | Payer: Medicare HMO | Source: Ambulatory Visit | Attending: Physician Assistant | Admitting: Physician Assistant

## 2022-09-22 DIAGNOSIS — R928 Other abnormal and inconclusive findings on diagnostic imaging of breast: Secondary | ICD-10-CM

## 2023-04-16 IMAGING — MG MM DIGITAL SCREENING BILAT W/ TOMO AND CAD
6 of 12 series · 6 of 36 positions shown · non-contrast
Comparison: Previous exam(s).

CLINICAL DATA: Screening.

EXAM:
DIGITAL SCREENING BILATERAL MAMMOGRAM WITH TOMOSYNTHESIS AND CAD
TECHNIQUE: Bilateral screening digital craniocaudal and mediolateral oblique
mammograms were obtained. Bilateral screening digital breast
tomosynthesis was performed. The images were evaluated with
computer-aided detection.

[R CC synth-2D]
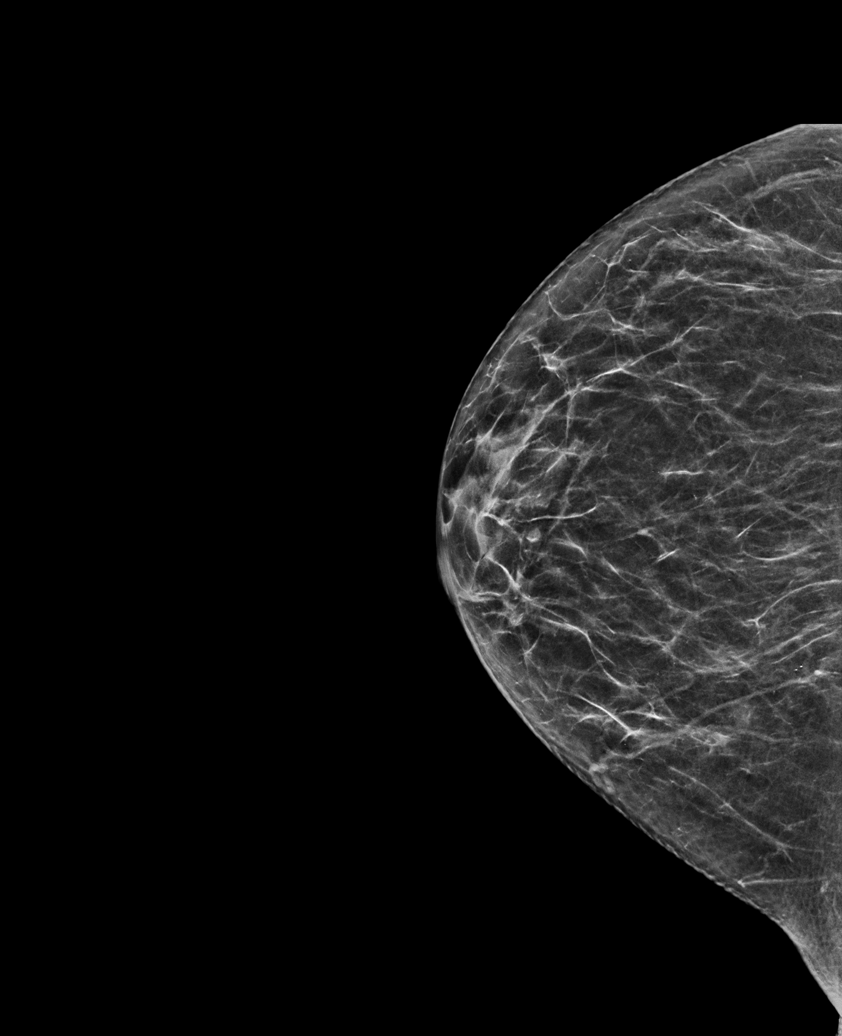

[R MLO synth-2D (1 of 2)]
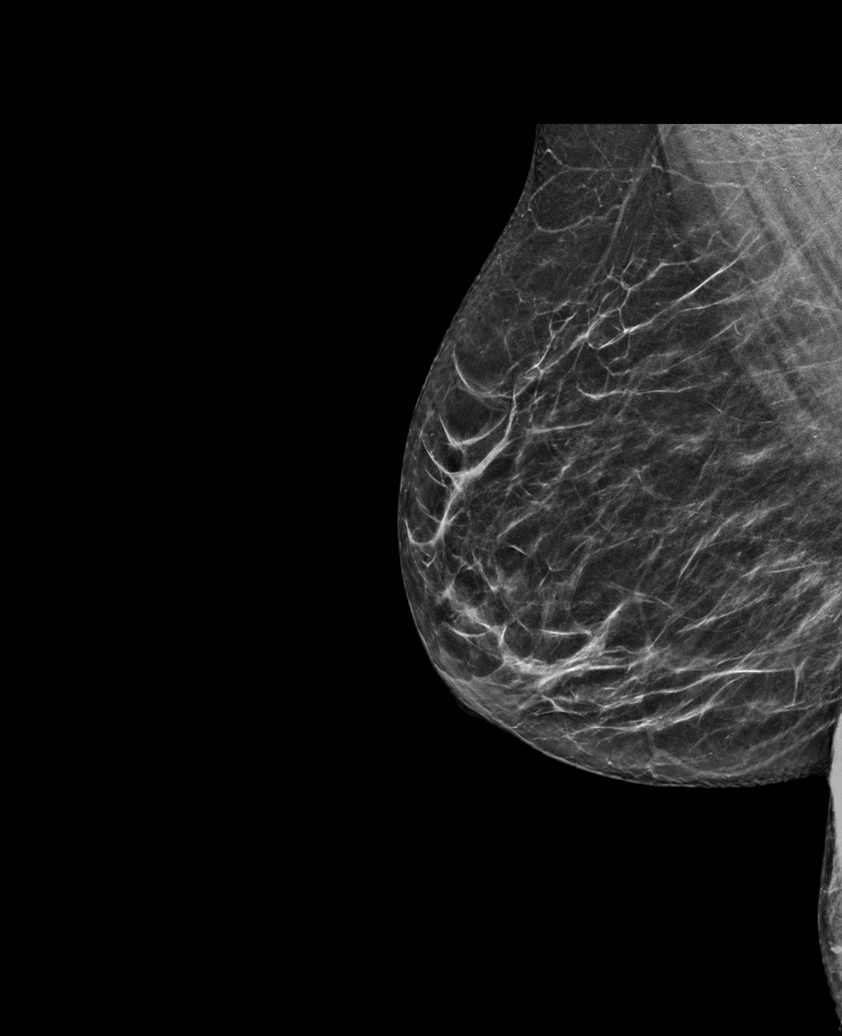

[R MLO synth-2D (2 of 2)]
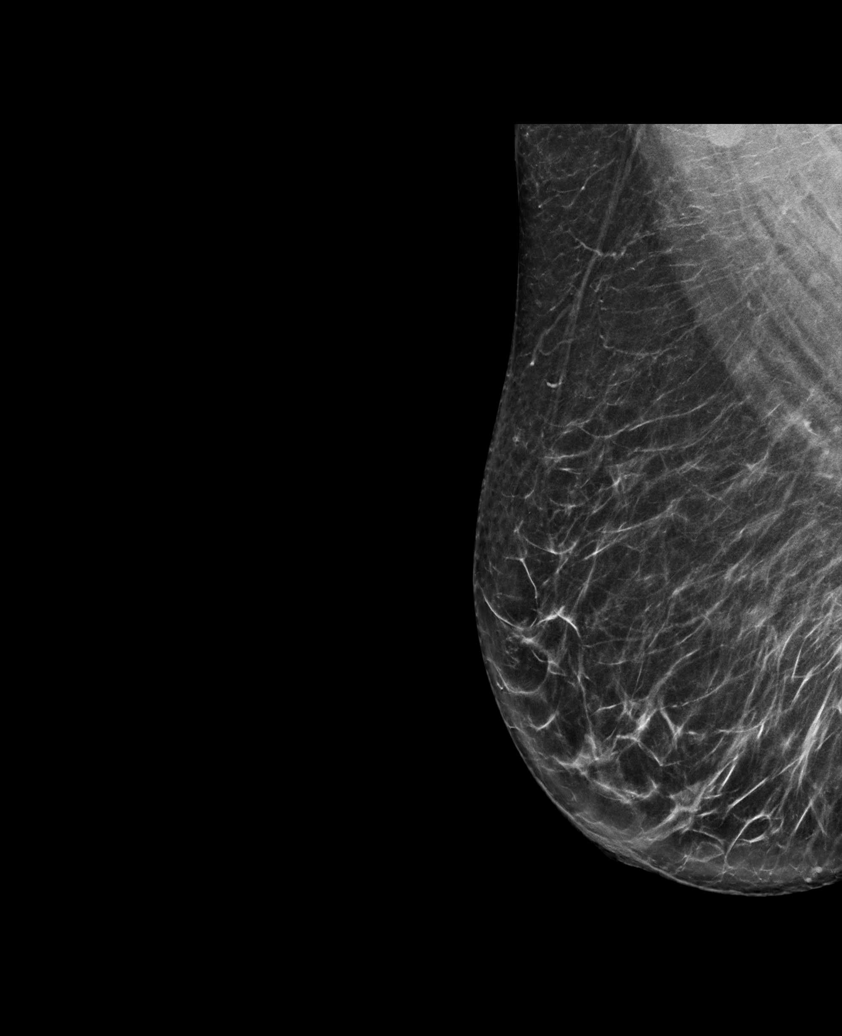

[L MLO synth-2D (1 of 2)]
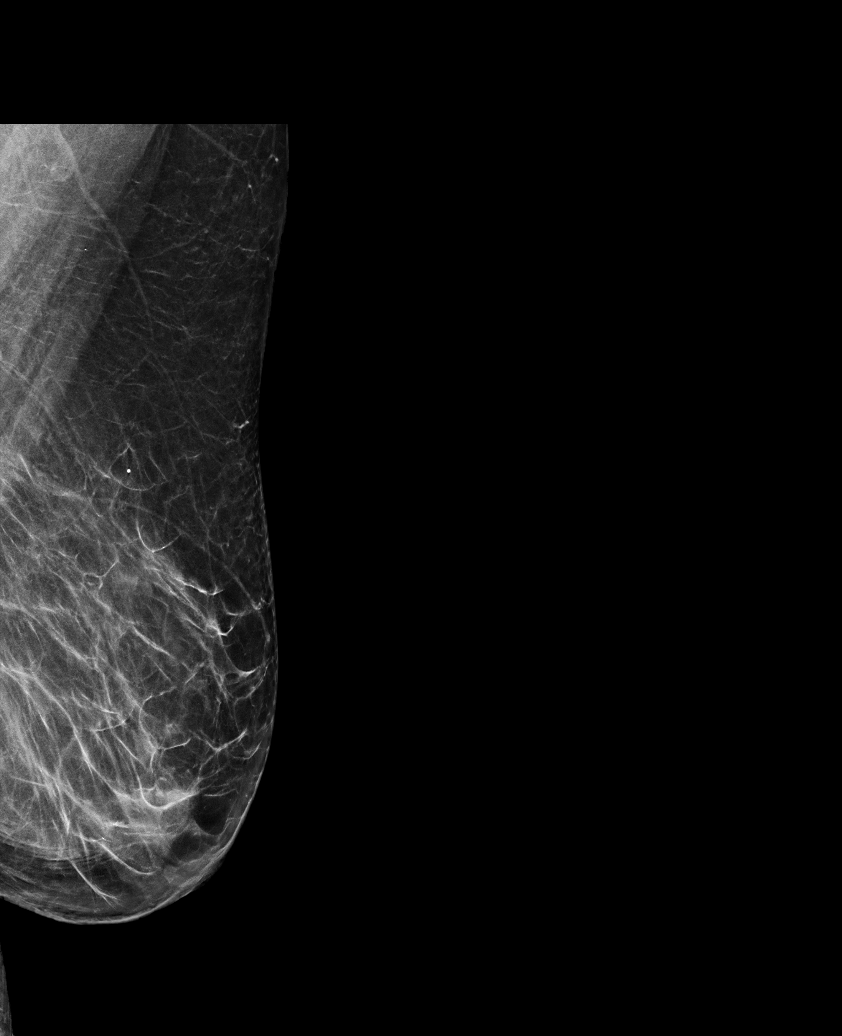

[L CC synth-2D]
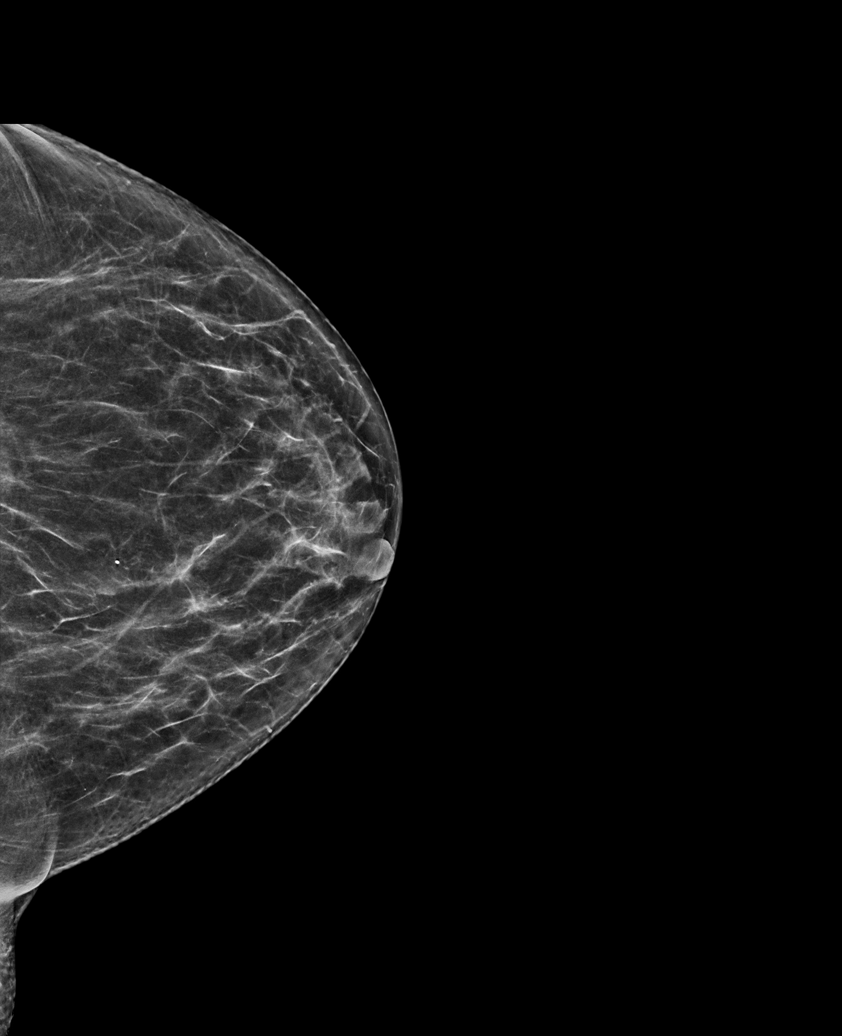

[L MLO synth-2D (2 of 2)]
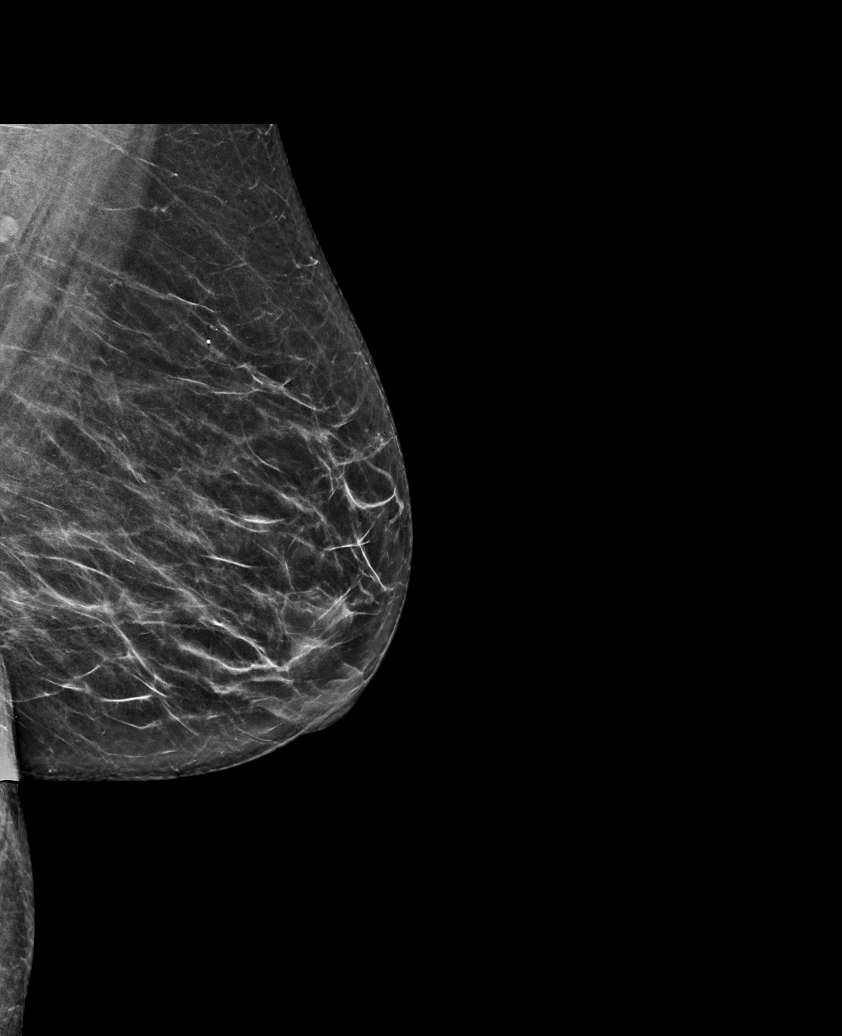

[6 of 36 positions shown; findings below may reference images not displayed]

ACR Breast Density Category b: There are scattered areas of
fibroglandular density.
FINDINGS: There are no findings suspicious for malignancy.
IMPRESSION: No mammographic evidence of malignancy. A result letter of this
screening mammogram will be mailed directly to the patient.

RECOMMENDATION:
Screening mammogram in one year. (Code:51-O-LD2)

BI-RADS CATEGORY  1: Negative.

## 2023-07-06 ENCOUNTER — Ambulatory Visit: Payer: Medicare HMO | Admitting: Emergency Medicine

## 2023-07-06 ENCOUNTER — Encounter: Payer: Self-pay | Admitting: Emergency Medicine

## 2023-07-06 VITALS — BP 136/66 | HR 76 | Temp 97.7°F | Ht 64.0 in | Wt 168.4 lb

## 2023-07-06 DIAGNOSIS — I152 Hypertension secondary to endocrine disorders: Secondary | ICD-10-CM

## 2023-07-06 DIAGNOSIS — Z7689 Persons encountering health services in other specified circumstances: Secondary | ICD-10-CM

## 2023-07-06 DIAGNOSIS — E1159 Type 2 diabetes mellitus with other circulatory complications: Secondary | ICD-10-CM | POA: Diagnosis not present

## 2023-07-06 DIAGNOSIS — Z7984 Long term (current) use of oral hypoglycemic drugs: Secondary | ICD-10-CM

## 2023-07-06 DIAGNOSIS — E039 Hypothyroidism, unspecified: Secondary | ICD-10-CM

## 2023-07-06 DIAGNOSIS — E1169 Type 2 diabetes mellitus with other specified complication: Secondary | ICD-10-CM | POA: Diagnosis not present

## 2023-07-06 DIAGNOSIS — E785 Hyperlipidemia, unspecified: Secondary | ICD-10-CM | POA: Diagnosis not present

## 2023-07-06 DIAGNOSIS — E118 Type 2 diabetes mellitus with unspecified complications: Secondary | ICD-10-CM | POA: Diagnosis not present

## 2023-07-06 LAB — CBC WITH DIFFERENTIAL/PLATELET
Basophils Absolute: 0.1 10*3/uL (ref 0.0–0.1)
Basophils Relative: 0.8 % (ref 0.0–3.0)
Eosinophils Absolute: 0.1 10*3/uL (ref 0.0–0.7)
Eosinophils Relative: 1.3 % (ref 0.0–5.0)
HCT: 39.7 % (ref 36.0–46.0)
Hemoglobin: 12.8 g/dL (ref 12.0–15.0)
Lymphocytes Relative: 19.2 % (ref 12.0–46.0)
Lymphs Abs: 1.5 10*3/uL (ref 0.7–4.0)
MCHC: 32.2 g/dL (ref 30.0–36.0)
MCV: 91.5 fL (ref 78.0–100.0)
Monocytes Absolute: 0.8 10*3/uL (ref 0.1–1.0)
Monocytes Relative: 10.6 % (ref 3.0–12.0)
Neutro Abs: 5.2 10*3/uL (ref 1.4–7.7)
Neutrophils Relative %: 68.1 % (ref 43.0–77.0)
Platelets: 250 10*3/uL (ref 150.0–400.0)
RBC: 4.34 Mil/uL (ref 3.87–5.11)
RDW: 13.3 % (ref 11.5–15.5)
WBC: 7.7 10*3/uL (ref 4.0–10.5)

## 2023-07-06 LAB — MICROALBUMIN / CREATININE URINE RATIO
Creatinine,U: 608.4 mg/dL
Microalb Creat Ratio: 0.2 mg/g (ref 0.0–30.0)
Microalb, Ur: 1.4 mg/dL (ref 0.0–1.9)

## 2023-07-06 LAB — COMPREHENSIVE METABOLIC PANEL
ALT: 18 U/L (ref 0–35)
AST: 24 U/L (ref 0–37)
Albumin: 3.9 g/dL (ref 3.5–5.2)
Alkaline Phosphatase: 41 U/L (ref 39–117)
BUN: 12 mg/dL (ref 6–23)
CO2: 27 meq/L (ref 19–32)
Calcium: 9.4 mg/dL (ref 8.4–10.5)
Chloride: 106 meq/L (ref 96–112)
Creatinine, Ser: 1.11 mg/dL (ref 0.40–1.20)
GFR: 49.09 mL/min — ABNORMAL LOW (ref >60.00)
Glucose, Bld: 110 mg/dL — ABNORMAL HIGH (ref 70–99)
Potassium: 3.8 meq/L (ref 3.5–5.1)
Sodium: 141 meq/L (ref 135–145)
Total Bilirubin: 0.4 mg/dL (ref 0.2–1.2)
Total Protein: 7.1 g/dL (ref 6.0–8.3)

## 2023-07-06 LAB — LIPID PANEL
Cholesterol: 153 mg/dL (ref 0–200)
HDL: 42.4 mg/dL (ref >39.00)
LDL Cholesterol: 88 mg/dL (ref 0–99)
NonHDL: 110.2
Total CHOL/HDL Ratio: 4
Triglycerides: 110 mg/dL (ref 0.0–149.0)
VLDL: 22 mg/dL (ref 0.0–40.0)

## 2023-07-06 LAB — HEMOGLOBIN A1C: Hgb A1c MFr Bld: 7.1 % — ABNORMAL HIGH (ref 4.6–6.5)

## 2023-07-06 LAB — TSH: TSH: 0.75 u[IU]/mL (ref 0.35–5.50)

## 2023-07-06 MED ORDER — AMLODIPINE BESYLATE 5 MG PO TABS: 5.0000 mg | ORAL_TABLET | Freq: Every day | ORAL | 3 refills | Status: DC

## 2023-07-06 NOTE — Assessment & Plan Note (Signed)
Well-controlled hypertension Continue amlodipine 5 mg, valsartan 320 mg, and nebivolol 10 mg daily Cardiovascular risks associated with hypertension discussed Needs blood work today to assess diabetes control In the meantime continue metformin 500 mg twice a day, Januvia 100 mg daily

## 2023-07-06 NOTE — Patient Instructions (Signed)

## 2023-07-06 NOTE — Assessment & Plan Note (Signed)
Diet and nutrition discussed Lipid profile done today Continue metformin and Januvia Continue simvastatin 40 mg daily

## 2023-07-06 NOTE — Progress Notes (Signed)
Alejandra Duffy 72 y.o.   Chief Complaint  Patient presents with   New Patient (Initial Visit)    Patient states she is having some issues with her hands and feet. Patient is a diabetic.   Referral to podiatry    HISTORY OF PRESENT ILLNESS: This is a 72 y.o. female first visit to this office, here to establish care with me Has the following chronic medical problems: 1.  Diabetes on metformin and Jardiance 2.  Hypertension on nebivolol and losartan 3.  Dyslipidemia on atorvastatin and fenofibrate 4.  History of IBS 5.  Hypothyroidism on Synthroid 6.  Chronic depression  BP Readings from Last 3 Encounters:  03/15/19 102/68  02/12/19 (!) 182/93  05/10/18 127/71   Lab Results  Component Value Date   HGBA1C 6.4 (H) 05/10/2018   Wt Readings from Last 3 Encounters:  07/06/23 168 lb 6 oz (76.4 kg)  03/15/19 158 lb 8 oz (71.9 kg)  02/21/19 160 lb (72.6 kg)     HPI   Prior to Admission medications   Medication Sig Start Date End Date Taking? Authorizing Provider  dicyclomine (BENTYL) 20 MG tablet Take 20 mg by mouth 2 (two) times daily.   Yes [provider]  fenofibrate 160 MG tablet Take 160 mg by mouth daily. 06/09/23  Yes [provider]  nebivolol (BYSTOLIC) 10 MG tablet Take 10 mg by mouth daily.   Yes [provider]  potassium chloride (KLOR-CON) 10 MEQ tablet Take 10 mEq by mouth 2 (two) times daily.   Yes [provider]  albuterol (PROVENTIL HFA) 108 (90 BASE) MCG/ACT inhaler Inhale 2 puffs into the lungs every 6 (six) hours as needed for wheezing or shortness of breath.     [provider]  amLODipine (NORVASC) 5 MG tablet Take 5 mg by mouth daily. Patient not taking: Reported on 03/27/2021    [provider]  Blood Glucose Monitoring Suppl (TRUE METRIX AIR GLUCOSE METER) w/Device KIT  12/22/21   [provider]  citalopram (CELEXA) 40 MG tablet Take 40 mg by mouth daily.    [provider]  clopidogrel (PLAVIX) 75 MG tablet clopidogrel 75 mg tablet  take 1 TABLET BY MOUTH EVERY DAY with food Patient not taking: Reported on 04/16/2022    [provider]  colchicine 0.6 MG tablet Take 0.6 mg by mouth 2 (two) times daily as needed (gout).  Patient not taking: Reported on 03/27/2021    [provider]  diphenhydrAMINE (BENADRYL) 25 MG tablet Take 50 mg by mouth daily as needed for allergies. Patient not taking: Reported on 04/16/2022    [provider]  diphenhydrAMINE (BENADRYL) 25 MG tablet Take 25 mg by mouth every 6 (six) hours as needed for allergies. Patient not taking: Reported on 04/16/2022    [provider]  diphenoxylate-atropine (LOMOTIL) 2.5-0.025 MG tablet Take by mouth 4 (four) times daily as needed for diarrhea or loose stools.    [provider]  gabapentin (NEURONTIN) 300 MG capsule Take 300 mg by mouth 2 (two) times daily. Patient not taking: Reported on 03/27/2021    [provider]  levofloxacin (LEVAQUIN) 500 MG tablet levofloxacin 500 mg tablet  TAKE 1 TABLET BY MOUTH EVERY 24 HOURS FOR 7 DAYS    [provider]  levothyroxine (SYNTHROID, LEVOTHROID) 50 MCG tablet Take 50 mcg by mouth daily before breakfast.     [provider]  loperamide (IMODIUM) 2 MG capsule Take 1 capsule (2 mg  total) by mouth 4 (four) times daily as needed for diarrhea or loose stools. Patient not taking: Reported on 04/16/2022 11/14/17   Ofilia Neas, PA-C  losartan (COZAAR) 50 MG tablet Take 50 mg by mouth daily. Patient not taking: Reported on 03/27/2021    [provider]  metFORMIN (GLUCOPHAGE) 500 MG tablet Take 500 mg by mouth 2 (two) times daily with a meal. Patient not taking: Reported on 03/27/2021    [provider]  methocarbamol (ROBAXIN) 750 MG tablet Take 750 mg by mouth 4 (four) times daily. Patient not taking: Reported on 04/16/2022    [provider]  methylPREDNISolone  (MEDROL DOSEPAK) 4 MG TBPK tablet 6 day dose pack - take as directed Patient not taking: Reported on 04/16/2022 02/16/22   Edwin Cap, DPM  naloxone [facility]) nasal spray 4 mg/0.1 mL naloxone 4 mg/actuation nasal spray Patient not taking: Reported on 04/16/2022    [provider]  omeprazole (PRILOSEC) 20 MG capsule Take 20 mg by mouth 2 (two) times daily before a meal.    [provider]  potassium chloride (KLOR-CON) 10 MEQ tablet Take by mouth. Patient not taking: Reported on 07/06/2023 12/10/21   [provider]  pregabalin (LYRICA) 75 MG capsule Take by mouth. 12/10/21   [provider]  rosuvastatin (CRESTOR) 10 MG tablet rosuvastatin 10 mg tablet Patient not taking: Reported on 04/16/2022    [provider]  simvastatin (ZOCOR) 40 MG tablet Take 40 mg by mouth daily.    [provider]  sitaGLIPtin (JANUVIA) 100 MG tablet Januvia 100 mg tablet  TAKE 1 TABLET BY MOUTH EVERY DAY    [provider]  traMADol-acetaminophen (ULTRACET) 37.5-325 MG tablet tramadol 37.5 mg-acetaminophen 325 mg tablet  TAKE 1 TO 2 TABLETS BY MOUTH EVERY 4 HOURS AS NEEDED FOR PAIN    [provider]  traZODone (DESYREL) 50 MG tablet Take 100 mg by mouth at bedtime.    [provider]  TRUE METRIX BLOOD GLUCOSE TEST test strip SMARTSIG:Via Meter 12/19/21   [provider]  valsartan (DIOVAN) 320 MG tablet Take 320 mg by mouth daily.    [provider]    Allergies  Allergen Reactions   Ibuprofen Other (See Comments)    Gasteric issues   Tetracyclines & Related Other (See Comments)    Builds up fluid in the brain   Tetracyclines & Related Other (See Comments)    Demeclocycline-Fluid on the brain    Patient Active Problem List   Diagnosis Date Noted   Hypertension 04/04/2012    Past Medical History:  Diagnosis Date   Arthritis    Asthma    Dementia (HCC)    Depression    Diabetes mellitus    type II    GERD (gastroesophageal reflux disease)    Gout    Hyperlipemia    Hyperparathyroidism (HCC)    Hypertension    Hypothyroidism    IBS (irritable bowel syndrome)    diarrhea   Thyroid disease     Past Surgical History:  Procedure Laterality Date   CHOLECYSTECTOMY     COLONOSCOPY  2013   DR Medoff   LEFT HEART CATH AND CORONARY ANGIOGRAPHY N/A 04/20/2017   Procedure: LEFT HEART CATH AND CORONARY ANGIOGRAPHY;  Surgeon: Rinaldo Cloud, MD;  Location: [facility];  Service: Cardiovascular;  Laterality: N/A;   TOOTH EXTRACTION Bilateral 05/10/2018   Procedure: DENTAL RESTORATION/EXTRACTIONS;  Surgeon: Ocie Doyne, DDS;  Location: [facility] OR;  Service: Oral Surgery;  Laterality: Bilateral;   VESICOVAGINAL FISTULA CLOSURE W/ TAH      Social History   Socioeconomic History   Marital status: Divorced    Spouse name: Not on file   Number of children: 2   Years of education: Not on file   Highest education level: Not on file  Occupational History   Occupation: Disabled    Employer: UNEMPLOYED  Tobacco Use   Smoking status: Former    Current packs/day: 0.00    Average packs/day: 0.3 packs/day for 15.0 years (4.5 ttl pk-yrs)    Types: Cigarettes    Start date: 03/31/1996    Quit date: 04/01/2011    Years since quitting: 12.2   Smokeless tobacco: Never  Vaping Use   Vaping status: Never Used  Substance and Sexual Activity   Alcohol use: No   Drug use: No   Sexual activity: Not on file  Other Topics Concern   Not on file  Social History Narrative   Not on file   Social Determinants of Health   Financial Resource Strain: Not on file  Food Insecurity: No Food Insecurity (03/27/2021)   Hunger Vital Sign    Worried About Running Out of Food in the Last Year: Never true    Ran Out of Food in the Last Year: Never true  Transportation Needs: Not on file  Physical Activity: Not on file  Stress: Not on file  Social Connections: Not on file  Intimate Partner Violence: Not on file     Family History  Problem Relation Age of Onset   Diabetes Mother    Asthma Father    Allergies Father    Allergies Brother    Hyperlipidemia Other    Breast cancer Neg Hx    Colon cancer Neg Hx    Stomach cancer Neg Hx      Review of Systems  Constitutional: Negative.  Negative for chills and fever.  HENT: Negative.  Negative for congestion and sore throat.   Respiratory: Negative.  Negative for cough and shortness of breath.   Cardiovascular: Negative.  Negative for chest pain and palpitations.  Gastrointestinal:  Negative for abdominal pain, nausea and vomiting.  Genitourinary: Negative.  Negative for dysuria and hematuria.  Skin: Negative.  Negative for rash.  Neurological: Negative.  Negative for dizziness and headaches.  All other systems reviewed and are negative.   Today's Vitals   07/06/23 1351  BP: 136/66  Pulse: 76  Temp: 97.7 F (36.5 C)  TempSrc: Oral  SpO2: 95%  Weight: 168 lb 6 oz (76.4 kg)  Height: 5\' 4"  (1.626 m)   Body mass index is 28.9 kg/m.   Physical Exam Vitals reviewed.  Constitutional:      Appearance: Normal appearance.  HENT:     Head: Normocephalic.     Mouth/Throat:     Mouth: Mucous membranes are moist.     Pharynx: Oropharynx is clear.     Comments: Edentulous Eyes:     Extraocular Movements: Extraocular movements intact.     Pupils: Pupils are equal, round, and reactive to light.  Cardiovascular:     Rate and Rhythm: Normal rate and regular rhythm.     Pulses: Normal pulses.     Heart sounds: Normal heart sounds.  Pulmonary:     Effort: Pulmonary effort is normal.     Breath sounds: Normal breath sounds.  Abdominal:     Palpations: Abdomen is soft.     Tenderness: There is  no abdominal tenderness.  Musculoskeletal:     Cervical back: No tenderness.     Comments: Feet: Warm to touch.  Good peripheral pulses.  Chronic nail changes.  Intact sensation  Lymphadenopathy:     Cervical: No cervical adenopathy.  Skin:     General: Skin is warm and dry.     Comments: Hands: Chronic nail changes  Neurological:     General: No focal deficit present.     Mental Status: She is alert and oriented to person, place, and time.  Psychiatric:        Mood and Affect: Mood normal.        Behavior: Behavior normal.      ASSESSMENT & PLAN: A total of 48 minutes was spent with the patient and counseling/coordination of care regarding preparing for this visit, review of available medical records, establishing care with me, comprehensive history and physical examination, review of multiple chronic medical conditions and their management, review of all medications and changes made, cardiovascular risks associated with hypertension and diabetes, education on nutrition, prognosis, documentation, review of health maintenance items, and need for follow-up.  Problem List Items Addressed This Visit       Cardiovascular and Mediastinum   Hypertension associated with diabetes (HCC) - Primary    Well-controlled hypertension Continue amlodipine 5 mg, valsartan 320 mg, and nebivolol 10 mg daily Cardiovascular risks associated with hypertension discussed Needs blood work today to assess diabetes control In the meantime continue metformin 500 mg twice a day, Januvia 100 mg daily      Relevant Medications   fenofibrate 160 MG tablet   amLODipine (NORVASC) 5 MG tablet   Other Relevant Orders   CBC with Differential/Platelet   Comprehensive metabolic panel   Hemoglobin A1c   Lipid panel   Urine Microalbumin w/creat. ratio     Endocrine   Dyslipidemia associated with type 2 diabetes mellitus (HCC)    Diet and nutrition discussed Lipid profile done today Continue metformin and Januvia Continue simvastatin 40 mg daily      Relevant Medications   fenofibrate 160 MG tablet   Other Relevant Orders   CBC with Differential/Platelet   Comprehensive metabolic panel   Hemoglobin A1c   Lipid panel   Urine Microalbumin w/creat.  ratio   Hypothyroidism    Clinically euthyroid TSH done today Continue Synthroid 50 mcg daily      Relevant Orders   TSH   Other Visit Diagnoses     Encounter to establish care       Diabetic feet [facility])       Relevant Orders   Ambulatory referral to Podiatry      Patient Instructions  Health Maintenance, Female Adopting a healthy lifestyle and getting preventive care are important in promoting health and wellness. Ask your health care provider about: The right schedule for you to have regular tests and exams. Things you can do on your own to prevent diseases and keep yourself healthy. What should I know about diet, weight, and exercise? Eat a healthy diet  Eat a diet that includes plenty of vegetables, fruits, low-fat dairy products, and lean protein. Do not eat a lot of foods that are high in solid fats, added sugars, or sodium. Maintain a healthy weight Body mass index (BMI) is used to identify weight problems. It estimates body fat based on height and weight. Your health care provider can help determine your BMI and help you achieve or maintain a healthy weight. Get regular exercise Get  regular exercise. This is one of the most important things you can do for your health. Most adults should: Exercise for at least 150 minutes each week. The exercise should increase your heart rate and make you sweat (moderate-intensity exercise). Do strengthening exercises at least twice a week. This is in addition to the moderate-intensity exercise. Spend less time sitting. Even light physical activity can be beneficial. Watch cholesterol and blood lipids Have your blood tested for lipids and cholesterol at 74 years of age, then have this test every 5 years. Have your cholesterol levels checked more often if: Your lipid or cholesterol levels are high. You are older than 74 years of age. You are at high risk for heart disease. What should I know about cancer screening? Depending on your  health history and family history, you may need to have cancer screening at various ages. This may include screening for: Breast cancer. Cervical cancer. Colorectal cancer. Skin cancer. Lung cancer. What should I know about heart disease, diabetes, and high blood pressure? Blood pressure and heart disease High blood pressure causes heart disease and increases the risk of stroke. This is more likely to develop in people who have high blood pressure readings or are overweight. Have your blood pressure checked: Every 3-5 years if you are 38-47 years of age. Every year if you are 8 years old or older. Diabetes Have regular diabetes screenings. This checks your fasting blood sugar level. Have the screening done: Once every three years after age 38 if you are at a normal weight and have a low risk for diabetes. More often and at a younger age if you are overweight or have a high risk for diabetes. What should I know about preventing infection? Hepatitis B If you have a higher risk for hepatitis B, you should be screened for this virus. Talk with your health care provider to find out if you are at risk for hepatitis B infection. Hepatitis C Testing is recommended for: Everyone born from [birth year] through 1967. Anyone with known risk factors for hepatitis C. Sexually transmitted infections (STIs) Get screened for STIs, including gonorrhea and chlamydia, if: You are sexually active and are younger than 74 years of age. You are older than 74 years of age and your health care provider tells you that you are at risk for this type of infection. Your sexual activity has changed since you were last screened, and you are at increased risk for chlamydia or gonorrhea. Ask your health care provider if you are at risk. Ask your health care provider about whether you are at high risk for HIV. Your health care provider may recommend a prescription medicine to help prevent HIV infection. If you choose to take  medicine to prevent HIV, you should first get tested for HIV. You should then be tested every 3 months for as long as you are taking the medicine. Pregnancy If you are about to stop having your period (premenopausal) and you may become pregnant, seek counseling before you get pregnant. Take 400 to 800 micrograms (mcg) of folic acid every day if you become pregnant. Ask for birth control (contraception) if you want to prevent pregnancy. Osteoporosis and menopause Osteoporosis is a disease in which the bones lose minerals and strength with aging. This can result in bone fractures. If you are 36 years old or older, or if you are at risk for osteoporosis and fractures, ask your health care provider if you should: Be screened for bone loss. Take a calcium  or vitamin D supplement to lower your risk of fractures. Be given hormone replacement therapy (HRT) to treat symptoms of menopause. Follow these instructions at home: Alcohol use Do not drink alcohol if: Your health care provider tells you not to drink. You are pregnant, may be pregnant, or are planning to become pregnant. If you drink alcohol: Limit how much you have to: 0-1 drink a day. Know how much alcohol is in your drink. In the [place] one drink equals one 12 oz bottle of beer (355 mL), one 5 oz glass of wine (148 mL), or one 1 oz glass of hard liquor (44 mL). Lifestyle Do not use any products that contain nicotine or tobacco. These products include cigarettes, chewing tobacco, and vaping devices, such as e-cigarettes. If you need help quitting, ask your health care provider. Do not use street drugs. Do not share needles. Ask your health care provider for help if you need support or information about quitting drugs. General instructions Schedule regular health, dental, and eye exams. Stay current with your vaccines. Tell your health care provider if: You often feel depressed. You have ever been abused or do not feel safe at  home. Summary Adopting a healthy lifestyle and getting preventive care are important in promoting health and wellness. Follow your health care provider's instructions about healthy diet, exercising, and getting tested or screened for diseases. Follow your health care provider's instructions on monitoring your cholesterol and blood pressure. This information is not intended to replace advice given to you by your health care provider. Make sure you discuss any questions you have with your health care provider. Document Revised: 01/06/2021 Document Reviewed: 01/06/2021 Elsevier Patient Education  2024 Elsevier Inc.      Edwina Barth, MD [facility]

## 2023-07-06 NOTE — Assessment & Plan Note (Signed)
Clinically euthyroid. TSH done today Continue Synthroid 50 mcg daily.

## 2023-07-09 ENCOUNTER — Telehealth: Payer: Self-pay | Admitting: Emergency Medicine

## 2023-07-09 NOTE — Telephone Encounter (Signed)
Prescription Request  07/09/2023  LOV: 07/06/2023  What is the name of the medication or equipment? sitaGLIPtin (JANUVIA) 100 MG tablet   Have you contacted your pharmacy to request a refill? No   Which pharmacy would you like this sent to?    Patient notified that their request is b  [id number] - [place], [place] - [address] AT [facility] [address] [place] [address] Phone: [phone] Fax: [phone] sent to the clinical staff for review and that they should receive a response within 2 business days.   Please advise at Mobile [phone] (mobile)   Pt was just seen in Office and she need her medication because she is completely out.. please advise

## 2023-07-11 ENCOUNTER — Other Ambulatory Visit: Payer: Self-pay | Admitting: Emergency Medicine

## 2023-07-11 MED ORDER — SITAGLIPTIN PHOSPHATE 100 MG PO TABS: 100.0000 mg | ORAL_TABLET | Freq: Every day | ORAL | 3 refills | Status: DC

## 2023-07-11 NOTE — Telephone Encounter (Signed)
New prescription for Januvia sent to pharmacy of record today. Thanks.

## 2023-07-15 ENCOUNTER — Other Ambulatory Visit: Payer: Self-pay | Admitting: Emergency Medicine

## 2023-07-15 DIAGNOSIS — Z1231 Encounter for screening mammogram for malignant neoplasm of breast: Secondary | ICD-10-CM

## 2023-07-22 ENCOUNTER — Ambulatory Visit (INDEPENDENT_AMBULATORY_CARE_PROVIDER_SITE_OTHER): Payer: Medicare HMO | Admitting: Podiatry

## 2023-07-22 DIAGNOSIS — B353 Tinea pedis: Secondary | ICD-10-CM | POA: Diagnosis not present

## 2023-07-22 DIAGNOSIS — L403 Pustulosis palmaris et plantaris: Secondary | ICD-10-CM | POA: Diagnosis not present

## 2023-07-22 MED ORDER — TERBINAFINE HCL 250 MG PO TABS: 250.0000 mg | ORAL_TABLET | Freq: Every day | ORAL | 0 refills | Status: AC

## 2023-07-22 MED ORDER — BETAMETHASONE DIPROPIONATE AUG 0.05 % EX OINT: TOPICAL_OINTMENT | Freq: Every day | CUTANEOUS | 0 refills | Status: DC

## 2023-07-22 MED ORDER — TACROLIMUS 0.1 % EX OINT: TOPICAL_OINTMENT | Freq: Two times a day (BID) | CUTANEOUS | 0 refills | Status: DC

## 2023-07-22 NOTE — Progress Notes (Signed)
  Subjective:  Patient ID: Alejandra Duffy, female    DOB: 11/01/1950,  MRN: [id number]  Chief Complaint  Patient presents with   Diabetes    RM24: 2nd Rt toe swollen; 3rd & 4th bilateral toenails are coming off along with thick skin around toes/toenails; diabetic foot and nail care with diabetic foot exam Last A1C: 7.1- (07/06/2023)patient states very painful    Discussed the use of AI scribe software for clinical note transcription with the patient, who gave verbal consent to proceed.  History of Present Illness   The patient, with a history of diabetes, presents with a year-long history of skin lesions on her toes and knees. She describes the lesions as 'breaking out' and causing itching. She has not had her A1C checked recently. She has been previously diagnosed with psoriasis and has been using an ointment prescribed by a dermatologist. Initially, the ointment seemed to clear up the lesions, but they have since reappeared despite continued use of the ointment.          Objective:    Physical Exam   CARDIOVASCULAR: Palpable pulses with good capillary refill time. EXTREMITIES: Warm and well perfused. SKIN: Erythematous blistering lesions on the tips of the toes and hands. Pustular plaques developing on the plantar arch bilaterally.              Results   Procedure: Photography of skin lesions Description: Photographs taken of erythematous blistering lesions on the tips of the toes, hands, and fingers, as well as pustular plaques on the plantar arch bilaterally.      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 Tinea pedis of both feet      Plan:  Patient was evaluated and treated and all questions answered.  Assessment and Plan    Pustular Psoriasis   She presents with erythematous blistering lesions on the tips of her toes, hands, fingers, and pustular plaques on the plantar arch bilaterally. Despite initial improvement with an unspecified  ointment, symptoms have recurred. We will prescribe Diprolene ointment for use once daily in the morning and Tacrolimus for use once daily in the evening. Reevaluation is scheduled in one month.  Possible Fungal Infection   Considering her diabetes and the location of the lesions, a concurrent fungal infection is possible. We will prescribe Terbinafine to address any potential fungal involvement.  Diabetes   She has not had a recent A1c check. We recommend checking A1c to assess diabetes control.          Return in about 1 month (around 08/21/2023) for follow up skin lesions.

## 2023-08-04 ENCOUNTER — Other Ambulatory Visit: Payer: Self-pay | Admitting: Radiology

## 2023-08-04 ENCOUNTER — Telehealth: Payer: Self-pay | Admitting: Emergency Medicine

## 2023-08-04 MED ORDER — TRAZODONE HCL 50 MG PO TABS: ORAL_TABLET | ORAL | 0 refills | Status: DC

## 2023-08-04 NOTE — Telephone Encounter (Signed)
Okay to refill trazodone?  

## 2023-08-04 NOTE — Telephone Encounter (Signed)
Prescription Request  08/04/2023  LOV: 07/06/2023  What is the name of the medication or equipment? trazadone  Have you contacted your pharmacy to request a refill? Yes   Which pharmacy would you like this sent to?   [id number] - [place], [place] - [address] AT [facility] [address] [place] [address] Phone: [phone] Fax: [phone]    Patient notified that their request is being sent to the clinical staff for review and that they should receive a response within 2 business days.   Please advise at Mobile [phone] (mobile)

## 2023-08-30 ENCOUNTER — Encounter: Payer: Self-pay | Admitting: Podiatry

## 2023-08-30 ENCOUNTER — Ambulatory Visit (INDEPENDENT_AMBULATORY_CARE_PROVIDER_SITE_OTHER): Payer: Medicare HMO | Admitting: Podiatry

## 2023-08-30 DIAGNOSIS — L403 Pustulosis palmaris et plantaris: Secondary | ICD-10-CM | POA: Diagnosis not present

## 2023-08-30 MED ORDER — BETAMETHASONE DIPROPIONATE AUG 0.05 % EX OINT: TOPICAL_OINTMENT | Freq: Every day | CUTANEOUS | 2 refills | Status: DC

## 2023-08-30 MED ORDER — TACROLIMUS 0.1 % EX OINT: TOPICAL_OINTMENT | Freq: Every day | CUTANEOUS | 1 refills | Status: DC

## 2023-08-30 NOTE — Progress Notes (Signed)
  Subjective:  Patient ID: Alejandra Duffy, female    DOB: 1951/04/09,  MRN: [id number]  Chief Complaint  Patient presents with   Foot Pain    She reports she is still having a lot of pain in the feet along with numbness and tingling.      History of Present Illness   She has been using both creams each once a day as directed they are still quite tender      Objective:    Physical Exam   CARDIOVASCULAR: Palpable pulses with good capillary refill time. EXTREMITIES: Warm and well perfused. SKIN: Erythema has resolved plaques are starting to heal as well                      Results   Procedure: Photography of skin lesions Description: Photographs taken of erythematous blistering lesions on the tips of the toes, hands, and fingers, as well as pustular plaques on the plantar arch bilaterally.      Assessment:   Encounter Diagnosis  Name Primary?   Pustular psoriasis of palms and soles Yes      Plan:  Patient was evaluated and treated and all questions answered.  Assessment and Plan    Pustular Psoriasis   Still quite tender and problematic for her but I do see clinical improvement based on her photographs of for 1 month.  I recommend we continue using the Diprolene and tacrolimus and refills of both were sent to pharmacy.  She will follow-up me in 2 months.  I am placing referral as well to see [facility] health dermatology and she will follow-up with them for a second opinion as well if not improving    Return in about 1 month (around 08/21/2023) for follow up skin lesions.

## 2023-09-04 ENCOUNTER — Other Ambulatory Visit: Payer: Self-pay | Admitting: Emergency Medicine

## 2023-09-16 ENCOUNTER — Ambulatory Visit
Admission: RE | Admit: 2023-09-16 | Discharge: 2023-09-16 | Disposition: A | Discharge status: Home / Self Care | Payer: Medicare HMO | Source: Ambulatory Visit | Attending: Emergency Medicine | Admitting: Emergency Medicine

## 2023-09-16 DIAGNOSIS — Z1231 Encounter for screening mammogram for malignant neoplasm of breast: Secondary | ICD-10-CM | POA: Diagnosis not present

## 2023-10-06 ENCOUNTER — Ambulatory Visit: Payer: Medicare HMO | Admitting: Emergency Medicine

## 2023-10-14 DIAGNOSIS — I25118 Atherosclerotic heart disease of native coronary artery with other forms of angina pectoris: Secondary | ICD-10-CM | POA: Diagnosis not present

## 2023-10-14 DIAGNOSIS — E119 Type 2 diabetes mellitus without complications: Secondary | ICD-10-CM | POA: Diagnosis not present

## 2023-10-14 DIAGNOSIS — I10 Essential (primary) hypertension: Secondary | ICD-10-CM | POA: Diagnosis not present

## 2023-10-14 DIAGNOSIS — R0789 Other chest pain: Secondary | ICD-10-CM | POA: Diagnosis not present

## 2023-11-02 ENCOUNTER — Ambulatory Visit (INDEPENDENT_AMBULATORY_CARE_PROVIDER_SITE_OTHER): Payer: Medicare HMO | Admitting: Podiatry

## 2023-11-02 ENCOUNTER — Encounter: Payer: Self-pay | Admitting: Podiatry

## 2023-11-02 DIAGNOSIS — L403 Pustulosis palmaris et plantaris: Secondary | ICD-10-CM

## 2023-11-02 MED ORDER — TRAMADOL HCL 50 MG PO TABS: 50.0000 mg | ORAL_TABLET | Freq: Four times a day (QID) | ORAL | 0 refills | Status: AC | PRN

## 2023-11-02 MED ORDER — TACROLIMUS 0.1 % EX OINT: TOPICAL_OINTMENT | Freq: Every day | CUTANEOUS | 1 refills | Status: DC

## 2023-11-02 MED ORDER — CLOBETASOL PROPIONATE 0.05 % EX OINT: TOPICAL_OINTMENT | Freq: Two times a day (BID) | CUTANEOUS | 1 refills | Status: AC

## 2023-11-02 NOTE — Progress Notes (Signed)
  Subjective:  Patient ID: Alejandra Duffy, female    DOB: Feb 19, 1951,  MRN: [id number]  Chief Complaint  Patient presents with   Follow-up    Patient states that bilateral feet have been bad, she is still using the cream , but she is still in pain      History of Present Illness   She returns for follow-up says she is only been using the tacrolimus, the pain has been quite severe      Objective:    Physical Exam   CARDIOVASCULAR: Palpable pulses with good capillary refill time. EXTREMITIES: Warm and well perfused. SKIN: Still with significant disease burden on the toes and hands                     Assessment:   Encounter Diagnosis  Name Primary?   Pustular psoriasis of palms and soles Yes      Plan:  Patient was evaluated and treated and all questions answered.  Assessment and Plan    Pustular Psoriasis   Still quite tender and problematic for her, I prescribed Temovate ointment unclear why she did not get the Diprolene possibly due to an insurance or pharmacy issue, she will continue to use this twice a day as well as the tacrolimus.  With her severe chronic pain I did prescribe her tramadol to use only as needed discussed with her that any refills she would need to discuss chronic pain management with her PCP.  Her dermatology referral was sent in December but she never received a call about it we will check on the status of this and hopefully they can get her in ASAP as she likely will require more specialized management than I am able to provide here with topical medications.

## 2023-11-05 ENCOUNTER — Telehealth: Payer: Self-pay

## 2023-11-05 NOTE — Telephone Encounter (Signed)
 Order, demographics and office note faxed to [facility]. I will check on Mondayto see if they are going to schedule -If not, will send somewhere else

## 2023-11-05 NOTE — Telephone Encounter (Signed)
-----   Message from Edwin Cap sent at 11/02/2023 10:50 AM EST ----- I sent to dermatology for [facility] dermatology out in December but it does not look like they have received it or did anything with it can you check and see if they can see her?  Not much I can do with her other than continue the creams I prescribed, if they can get her in ASAP that would be great otherwise if we can send somewhere else

## 2023-11-10 ENCOUNTER — Other Ambulatory Visit: Payer: Self-pay | Admitting: Podiatry

## 2023-11-12 ENCOUNTER — Telehealth: Payer: Self-pay | Admitting: Podiatry

## 2023-11-12 NOTE — Telephone Encounter (Signed)
 Patient is stating she never heard from Dermatology and would like for Dr. Lilian Kapur to check the status of referral. Patient contact telephone number, [phone]

## 2023-11-16 ENCOUNTER — Other Ambulatory Visit: Payer: Self-pay | Admitting: Emergency Medicine

## 2023-11-16 NOTE — Telephone Encounter (Signed)
 Copied from CRM [id number]. Topic: Clinical - Medication Refill >> Nov 16, 2023  3:13 PM Efraim Kaufmann C wrote: Most Recent Primary Care Visit:  Provider: Georgina Quint  Department: [facility]  Visit Type: NEW PATIENT  Date: 07/06/2023  Medication: levothyroxine (SYNTHROID, LEVOTHROID) 50 MCG tablet   Has the patient contacted their pharmacy? Yes (Agent: If no, request that the patient contact the pharmacy for the refill. If patient does not wish to contact the pharmacy document the reason why and proceed with request.) (Agent: If yes, when and what did the pharmacy advise?)  Is this the correct pharmacy for this prescription? Yes If no, delete pharmacy and type the correct one.  This is the patient's preferred pharmacy:  [id number] - [place], [place] - [address] AT [address] [place] [address] Phone: [phone] Fax: [phone]   Has the prescription been filled recently? No  Is the patient out of the medication? Yes  Has the patient been seen for an appointment in the last year OR does the patient have an upcoming appointment? Yes  Can we respond through MyChart? Yes  Agent: Please be advised that Rx refills may take up to 3 business days. We ask that you follow-up with your pharmacy.  Patient also mentioned Vitamin D, however I do not see that as part of her current prescription medications.

## 2023-11-19 MED ORDER — LEVOTHYROXINE SODIUM 50 MCG PO TABS: 50.0000 ug | ORAL_TABLET | Freq: Every day | ORAL | 1 refills | Status: DC

## 2023-12-03 ENCOUNTER — Other Ambulatory Visit: Payer: Self-pay | Admitting: Podiatry

## 2023-12-07 ENCOUNTER — Other Ambulatory Visit: Payer: Self-pay | Admitting: Emergency Medicine

## 2023-12-07 NOTE — Telephone Encounter (Signed)
 Copied from CRM [id number]. Topic: Clinical - Medication Refill >> Dec 07, 2023  4:12 PM Presley Raddle C wrote: Most Recent Primary Care Visit:  Provider: Georgina Quint  Department: [facility]  Visit Type: NEW PATIENT  Date: 07/06/2023  Medication: omeprazole (PRILOSEC) 20 MG capsule [[id number]], tramadol  Has the patient contacted their pharmacy? Yes Claims the pharmacy has attempted to reach out but no response.   Is this the correct pharmacy for this prescription? Yes If no, delete pharmacy and type the correct one.  This is the patient's preferred pharmacy:  [id number] - [place], [place] - [address] AT [facility] [facility] [address] [place] [address] Phone: [phone] Fax: [phone]   Has the prescription been filled recently? Yes  Is the patient out of the medication? Yes  Has the patient been seen for an appointment in the last year OR does the patient have an upcoming appointment? No  Can we respond through MyChart? No  Agent: Please be advised that Rx refills may take up to 3 business days. We ask that you follow-up with your pharmacy.

## 2023-12-10 MED ORDER — OMEPRAZOLE 20 MG PO CPDR: 20.0000 mg | DELAYED_RELEASE_CAPSULE | Freq: Every day | ORAL | 1 refills | Status: DC

## 2023-12-11 ENCOUNTER — Other Ambulatory Visit: Payer: Self-pay | Admitting: Emergency Medicine

## 2023-12-27 ENCOUNTER — Ambulatory Visit: Admitting: Emergency Medicine

## 2024-01-07 ENCOUNTER — Other Ambulatory Visit: Payer: Self-pay | Admitting: Emergency Medicine

## 2024-01-07 NOTE — Telephone Encounter (Signed)
 Copied from CRM [id number]. Topic: Clinical - Medication Refill >> Jan 07, 2024  2:28 PM Dyann Glaser G wrote: Medication: potassium chloride (KLOR-CON) 10 MEQ tablet, albuterol  (PROVENTIL  HFA) 108 (90 BASE) MCG/ACT inhaler, dicyclomine (BENTYL) 20 MG tablet, fenofibrate 160 MG tablet, simvastatin (ZOCOR) 40 MG tablet,traZODone  (DESYREL ) 50 MG tablet, nebivolol  (BYSTOLIC ) 10 MG tablet, AND pregabalin (LYRICA) 75 MG capsule.  Has the patient contacted their pharmacy? Yes (Agent: If no, request that the patient contact the pharmacy for the refill. If patient does not wish to contact the pharmacy document the reason why and proceed with request.) (Agent: If yes, when and what did the pharmacy advise?)  This is the patient's preferred pharmacy:  [facility]  [address]  [place] [address] [phone] FAX#[phone]  Is this the correct pharmacy for this prescription? Yes If no, delete pharmacy and type the correct one.   Has the prescription been filled recently? No  Is the patient out of the medication? No  Has the patient been seen for an appointment in the last year OR does the patient have an upcoming appointment? Yes  Can we respond through MyChart? Yes  Agent: Please be advised that Rx refills may take up to 3 business days. We ask that you follow-up with your pharmacy.

## 2024-01-12 ENCOUNTER — Telehealth: Payer: Self-pay

## 2024-01-12 NOTE — Telephone Encounter (Signed)
 This patient is appearing on a report for being at risk of failing the adherence measure for diabetes medications this calendar year.   Medication: sitagliptin  100 mg daily Last fill date: 12/15/23 for 30 day supply  Insurance report was not up to date. No action needed at this time.   Abelina Abide, PharmD PGY1 Pharmacy Resident 01/12/2024 4:18 PM

## 2024-01-17 MED ORDER — ALBUTEROL SULFATE HFA 108 (90 BASE) MCG/ACT IN AERS: 2.0000 | INHALATION_SPRAY | Freq: Four times a day (QID) | RESPIRATORY_TRACT | 1 refills | Status: DC | PRN

## 2024-01-17 MED ORDER — NEBIVOLOL HCL 10 MG PO TABS: 10.0000 mg | ORAL_TABLET | Freq: Every day | ORAL | 1 refills | Status: DC

## 2024-01-17 MED ORDER — SIMVASTATIN 40 MG PO TABS: 40.0000 mg | ORAL_TABLET | Freq: Every day | ORAL | 1 refills | Status: DC

## 2024-01-17 MED ORDER — PREGABALIN 75 MG PO CAPS: 75.0000 mg | ORAL_CAPSULE | Freq: Two times a day (BID) | ORAL | 0 refills | Status: AC

## 2024-01-17 MED ORDER — POTASSIUM CHLORIDE CRYS ER 10 MEQ PO TBCR: 10.0000 meq | EXTENDED_RELEASE_TABLET | Freq: Two times a day (BID) | ORAL | 1 refills | Status: DC

## 2024-01-17 MED ORDER — DICYCLOMINE HCL 20 MG PO TABS: 20.0000 mg | ORAL_TABLET | Freq: Two times a day (BID) | ORAL | 1 refills | Status: DC

## 2024-01-17 MED ORDER — FENOFIBRATE 160 MG PO TABS: 160.0000 mg | ORAL_TABLET | Freq: Every day | ORAL | 1 refills | Status: AC

## 2024-01-17 MED ORDER — TRAZODONE HCL 50 MG PO TABS: ORAL_TABLET | ORAL | 1 refills | Status: DC

## 2024-01-20 ENCOUNTER — Encounter: Payer: Self-pay | Admitting: Emergency Medicine

## 2024-01-20 ENCOUNTER — Ambulatory Visit: Payer: Self-pay | Admitting: Emergency Medicine

## 2024-01-20 ENCOUNTER — Ambulatory Visit (INDEPENDENT_AMBULATORY_CARE_PROVIDER_SITE_OTHER): Admitting: Emergency Medicine

## 2024-01-20 VITALS — BP 136/78 | HR 56 | Temp 98.6°F | Ht 64.0 in | Wt 161.8 lb

## 2024-01-20 DIAGNOSIS — B372 Candidiasis of skin and nail: Secondary | ICD-10-CM | POA: Diagnosis not present

## 2024-01-20 DIAGNOSIS — E1159 Type 2 diabetes mellitus with other circulatory complications: Secondary | ICD-10-CM

## 2024-01-20 DIAGNOSIS — N1831 Chronic kidney disease, stage 3a: Secondary | ICD-10-CM | POA: Diagnosis not present

## 2024-01-20 DIAGNOSIS — Z1211 Encounter for screening for malignant neoplasm of colon: Secondary | ICD-10-CM

## 2024-01-20 DIAGNOSIS — E785 Hyperlipidemia, unspecified: Secondary | ICD-10-CM

## 2024-01-20 DIAGNOSIS — E039 Hypothyroidism, unspecified: Secondary | ICD-10-CM

## 2024-01-20 DIAGNOSIS — E1169 Type 2 diabetes mellitus with other specified complication: Secondary | ICD-10-CM | POA: Diagnosis not present

## 2024-01-20 DIAGNOSIS — Z13820 Encounter for screening for osteoporosis: Secondary | ICD-10-CM

## 2024-01-20 DIAGNOSIS — I152 Hypertension secondary to endocrine disorders: Secondary | ICD-10-CM

## 2024-01-20 DIAGNOSIS — Z7984 Long term (current) use of oral hypoglycemic drugs: Secondary | ICD-10-CM | POA: Diagnosis not present

## 2024-01-20 LAB — COMPREHENSIVE METABOLIC PANEL WITH GFR
ALT: 7 U/L (ref 0–35)
AST: 10 U/L (ref 0–37)
Albumin: 3.8 g/dL (ref 3.5–5.2)
Alkaline Phosphatase: 39 U/L (ref 39–117)
BUN: 12 mg/dL (ref 6–23)
CO2: 30 meq/L (ref 19–32)
Calcium: 8.8 mg/dL (ref 8.4–10.5)
Chloride: 107 meq/L (ref 96–112)
Creatinine, Ser: 1.1 mg/dL (ref 0.40–1.20)
GFR: 49.43 mL/min — ABNORMAL LOW (ref >60.00)
Glucose, Bld: 86 mg/dL (ref 70–99)
Potassium: 3.5 meq/L (ref 3.5–5.1)
Sodium: 142 meq/L (ref 135–145)
Total Bilirubin: 0.3 mg/dL (ref 0.2–1.2)
Total Protein: 6.9 g/dL (ref 6.0–8.3)

## 2024-01-20 LAB — LIPID PANEL
Cholesterol: 161 mg/dL (ref 0–200)
HDL: 51.4 mg/dL (ref >39.00)
LDL Cholesterol: 92 mg/dL (ref 0–99)
NonHDL: 109.65
Total CHOL/HDL Ratio: 3
Triglycerides: 89 mg/dL (ref 0.0–149.0)
VLDL: 17.8 mg/dL (ref 0.0–40.0)

## 2024-01-20 LAB — CBC WITH DIFFERENTIAL/PLATELET
Basophils Absolute: 0 10*3/uL (ref 0.0–0.1)
Basophils Relative: 0.6 % (ref 0.0–3.0)
Eosinophils Absolute: 0.1 10*3/uL (ref 0.0–0.7)
Eosinophils Relative: 0.9 % (ref 0.0–5.0)
HCT: 36.3 % (ref 36.0–46.0)
Hemoglobin: 11.9 g/dL — ABNORMAL LOW (ref 12.0–15.0)
Lymphocytes Relative: 16.5 % (ref 12.0–46.0)
Lymphs Abs: 1.4 10*3/uL (ref 0.7–4.0)
MCHC: 32.9 g/dL (ref 30.0–36.0)
MCV: 86.1 fl (ref 78.0–100.0)
Monocytes Absolute: 0.7 10*3/uL (ref 0.1–1.0)
Monocytes Relative: 8.5 % (ref 3.0–12.0)
Neutro Abs: 6.1 10*3/uL (ref 1.4–7.7)
Neutrophils Relative %: 73.5 % (ref 43.0–77.0)
Platelets: 226 10*3/uL (ref 150.0–400.0)
RBC: 4.22 Mil/uL (ref 3.87–5.11)
RDW: 14.1 % (ref 11.5–15.5)
WBC: 8.3 10*3/uL (ref 4.0–10.5)

## 2024-01-20 LAB — POCT GLYCOSYLATED HEMOGLOBIN (HGB A1C): Hemoglobin A1C: 5.8 % — AB (ref 4.0–5.6)

## 2024-01-20 MED ORDER — FARXIGA 10 MG PO TABS: 10.0000 mg | ORAL_TABLET | Freq: Every day | ORAL | 3 refills | Status: AC

## 2024-01-20 NOTE — Assessment & Plan Note (Signed)
Clinically euthyroid. TSH done today Continue Synthroid 50 mcg daily.

## 2024-01-20 NOTE — Assessment & Plan Note (Signed)
 Extensive skin yeast dermatitis with also onychomycosis of all fingernails and toenails.  Unresponsive to topical treatments.  Active and affecting quality of life.  Recommend evaluation by infectious diseases.  Referral placed today.

## 2024-01-20 NOTE — Assessment & Plan Note (Signed)
 Diet and nutrition discussed Lipid profile done today Continue metformin and Januvia Continue simvastatin 40 mg daily

## 2024-01-20 NOTE — Assessment & Plan Note (Signed)
 Chronic stable condition Advised to stay well-hydrated and avoid NSAIDs Recommend to start Farxiga 10 mg daily for kidney protection.

## 2024-01-20 NOTE — Patient Instructions (Signed)

## 2024-01-20 NOTE — Progress Notes (Signed)
 Alejandra Duffy 73 y.o.   Chief Complaint  Patient presents with   Follow-up    Rm1 Pt states having pain in both he feet and hands x61yr     HISTORY OF PRESENT ILLNESS: This is a 73 y.o. female here for follow-up of chronic medical conditions including hypertension and diabetes Also has been dealing with painful rash to both hands for the last year Has seen dermatologist and foot doctor.  Multiple different corticosteroid creams not helping No other complaints or medical concerns today. Lab Results  Component Value Date   HGBA1C 7.1 (H) 07/06/2023   BP Readings from Last 3 Encounters:  07/06/23 136/66  03/15/19 102/68  02/12/19 (!) 182/93   Wt Readings from Last 3 Encounters:  07/06/23 168 lb 6 oz (76.4 kg)  03/15/19 158 lb 8 oz (71.9 kg)  02/21/19 160 lb (72.6 kg)     HPI   Prior to Admission medications   Medication Sig Start Date End Date Taking? Authorizing Provider  albuterol  (PROVENTIL  HFA) 108 (90 Base) MCG/ACT inhaler Inhale 2 puffs into the lungs every 6 (six) hours as needed for wheezing or shortness of breath. 01/17/24  Yes Millicent Blazejewski, Isidro Margo, MD  amLODipine  (NORVASC ) 5 MG tablet Take 1 tablet (5 mg total) by mouth daily. 07/06/23  Yes Amerika Nourse, Isidro Margo, MD  Blood Glucose Monitoring Suppl (TRUE METRIX AIR GLUCOSE METER) w/Device KIT  12/22/21  Yes [provider]  citalopram (CELEXA) 40 MG tablet Take 40 mg by mouth daily.   Yes [provider]  clobetasol  ointment (TEMOVATE ) 0.05 % Apply topically 2 (two) times daily. 11/02/23  Yes McDonald, Olive Better, DPM  dicyclomine  (BENTYL ) 20 MG tablet Take 1 tablet (20 mg total) by mouth 2 (two) times daily. 01/17/24  Yes Francesa Eugenio, Isidro Margo, MD  fenofibrate  160 MG tablet Take 1 tablet (160 mg total) by mouth daily. 01/17/24  Yes Cledis Sohn, Isidro Margo, MD  levothyroxine  (SYNTHROID ) 50 MCG tablet TAKE 1 TABLET BY MOUTH DAILY BEFORE BREAKFAST 12/11/23  Yes Docia Klar, Isidro Margo, MD  nebivolol   (BYSTOLIC ) 10 MG tablet Take 1 tablet (10 mg total) by mouth daily. Patient taking differently: Take 10 mg by mouth daily. Patient is taking 5 mg 01/17/24  Yes Kethan Papadopoulos, Isidro Margo, MD  omeprazole  (PRILOSEC) 20 MG capsule Take 1 capsule (20 mg total) by mouth daily. 12/10/23  Yes Mykayla Brinton, Isidro Margo, MD  potassium chloride  (KLOR-CON  M) 10 MEQ tablet Take 1 tablet (10 mEq total) by mouth 2 (two) times daily. 01/17/24  Yes Aneeka Bowden, Isidro Margo, MD  simvastatin  (ZOCOR ) 40 MG tablet Take 1 tablet (40 mg total) by mouth daily. 01/17/24  Yes Concepcion Gillott, Isidro Margo, MD  sitaGLIPtin  (JANUVIA ) 100 MG tablet Take 1 tablet (100 mg total) by mouth daily. 07/11/23  Yes Tashima Scarpulla, Isidro Margo, MD  tacrolimus  (PROTOPIC ) 0.1 % ointment APPLY TO AFFECTED AREA TOPICALLY EVERY DAY 11/11/23  Yes McDonald, Olive Better, DPM  traZODone  (DESYREL ) 50 MG tablet Take 100 mg by mouth at bedtime. 01/17/24  Yes Romello Hoehn, Isidro Margo, MD  valsartan  (DIOVAN ) 320 MG tablet Take 320 mg by mouth daily.   Yes [provider]  ciclopirox (LOPROX) 0.77 % cream SMARTSIG:1 Sparingly Topical Twice Daily PRN 06/10/23   [provider]  colchicine 0.6 MG tablet Take 0.6 mg by mouth 2 (two) times daily as needed (gout). Patient not taking: Reported on 01/20/2024    [provider]  diphenhydrAMINE (BENADRYL) 25 MG tablet Take 50 mg by mouth daily as needed  for allergies.    [provider]  diphenhydrAMINE (BENADRYL) 25 MG tablet Take 25 mg by mouth every 6 (six) hours as needed for allergies.    [provider]  diphenoxylate-atropine (LOMOTIL) 2.5-0.025 MG tablet Take by mouth 4 (four) times daily as needed for diarrhea or loose stools.    [provider]  gabapentin (NEURONTIN) 300 MG capsule Take 300 mg by mouth 2 (two) times daily.    [provider]  levofloxacin (LEVAQUIN) 500 MG tablet levofloxacin 500 mg tablet  TAKE 1 TABLET BY MOUTH EVERY 24 HOURS FOR 7 DAYS    [provider]  loperamide  (IMODIUM ) 2 MG capsule Take 1 capsule (2 mg total) by mouth 4 (four) times daily as needed for diarrhea or loose stools. 11/14/17   Bettie Bruce, PA-C  losartan (COZAAR) 50 MG tablet Take 50 mg by mouth daily.    [provider]  metFORMIN (GLUCOPHAGE) 500 MG tablet Take 500 mg by mouth 2 (two) times daily with a meal.    [provider]  methocarbamol (ROBAXIN) 750 MG tablet Take 750 mg by mouth 4 (four) times daily.    [provider]  methylPREDNISolone  (MEDROL  DOSEPAK) 4 MG TBPK tablet 6 day dose pack - take as directed 02/16/22   Floyce Hutching, DPM  naloxone [facility]) nasal spray 4 mg/0.1 mL     [provider]  potassium chloride  (KLOR-CON ) 10 MEQ tablet Take by mouth. 12/10/21   [provider]  pregabalin  (LYRICA ) 75 MG capsule Take 1 capsule (75 mg total) by mouth 2 (two) times daily. 01/17/24   Elvira Hammersmith, MD  rosuvastatin (CRESTOR) 10 MG tablet     [provider]  TRUE METRIX BLOOD GLUCOSE TEST test strip  12/19/21   [provider]    Allergies  Allergen Reactions   Ibuprofen Other (See Comments)    Gasteric issues   Tetracyclines & Related Other (See Comments)    Builds up fluid in the brain   Tetracyclines & Related Other (See Comments)    Demeclocycline-Fluid on the brain    Patient Active Problem List   Diagnosis Date Noted   Dyslipidemia associated with type 2 diabetes mellitus (HCC) 07/06/2023   Hypothyroidism    Hypertension associated with diabetes (HCC) 04/04/2012    Past Medical History:  Diagnosis Date   Arthritis    Asthma    Dementia (HCC)    Depression    Diabetes mellitus    type II   GERD (gastroesophageal reflux disease)    Gout    Hyperlipemia    Hyperparathyroidism (HCC)    Hypertension    Hypothyroidism    IBS (irritable bowel syndrome)    diarrhea   Thyroid disease     Past Surgical History:  Procedure Laterality Date    CHOLECYSTECTOMY     COLONOSCOPY  2013   DR Medoff   LEFT HEART CATH AND CORONARY ANGIOGRAPHY N/A 04/20/2017   Procedure: LEFT HEART CATH AND CORONARY ANGIOGRAPHY;  Surgeon: Chapman Commodore, MD;  Location: [facility];  Service: Cardiovascular;  Laterality: N/A;   TOOTH EXTRACTION Bilateral 05/10/2018   Procedure: DENTAL RESTORATION/EXTRACTIONS;  Surgeon: Ascencion Lava, DDS;  Location: [facility] OR;  Service: Oral Surgery;  Laterality: Bilateral;   VESICOVAGINAL FISTULA CLOSURE W/ TAH      Social History   Socioeconomic History   Marital status: Divorced    Spouse name: Not on file   Number of children: 2  Years of education: Not on file   Highest education level: Not on file  Occupational History   Occupation: Disabled    Employer: UNEMPLOYED  Tobacco Use   Smoking status: Former    Current packs/day: 0.00    Average packs/day: 0.3 packs/day for 15.0 years (4.5 ttl pk-yrs)    Types: Cigarettes    Start date: 03/31/1996    Quit date: 04/01/2011    Years since quitting: 12.8   Smokeless tobacco: Never  Vaping Use   Vaping status: Never Used  Substance and Sexual Activity   Alcohol use: No   Drug use: No   Sexual activity: Not on file  Other Topics Concern   Not on file  Social History Narrative   Not on file   Social Drivers of Health   Financial Resource Strain: Not on file  Food Insecurity: No Food Insecurity (03/27/2021)   Hunger Vital Sign    Worried About Running Out of Food in the Last Year: Never true    Ran Out of Food in the Last Year: Never true  Transportation Needs: Not on file  Physical Activity: Not on file  Stress: Not on file  Social Connections: Not on file  Intimate Partner Violence: Not on file    Family History  Problem Relation Age of Onset   Diabetes Mother    Asthma Father    Allergies Father    Allergies Brother    Hyperlipidemia Other    Breast cancer Neg Hx    Colon cancer Neg Hx    Stomach cancer Neg Hx      Review of Systems   Constitutional: Negative.  Negative for chills and fever.  HENT: Negative.  Negative for congestion and sore throat.   Respiratory: Negative.  Negative for cough and shortness of breath.   Cardiovascular: Negative.  Negative for chest pain and palpitations.  Gastrointestinal:  Negative for nausea and vomiting.  Genitourinary: Negative.  Negative for dysuria and hematuria.  Skin:  Positive for rash.  Neurological:  Negative for dizziness and headaches.  All other systems reviewed and are negative.   Today's Vitals   01/20/24 1058  BP: 136/78  Pulse: (!) 56  Temp: 98.6 F (37 C)  SpO2: 98%  Weight: 161 lb 12.8 oz (73.4 kg)  Height: 5\' 4"  (1.626 m)   Body mass index is 27.77 kg/m.   Physical Exam Vitals reviewed.  Constitutional:      Appearance: Normal appearance.  HENT:     Head: Normocephalic.     Mouth/Throat:     Mouth: Mucous membranes are moist.     Pharynx: Oropharynx is clear.  Eyes:     Extraocular Movements: Extraocular movements intact.     Conjunctiva/sclera: Conjunctivae normal.     Pupils: Pupils are equal, round, and reactive to light.  Cardiovascular:     Rate and Rhythm: Normal rate and regular rhythm.     Pulses: Normal pulses.     Heart sounds: Normal heart sounds.  Pulmonary:     Effort: Pulmonary effort is normal.     Breath sounds: Normal breath sounds.  Musculoskeletal:     Cervical back: No tenderness.  Lymphadenopathy:     Cervical: No cervical adenopathy.  Skin:    General: Skin is warm and dry.     Capillary Refill: Capillary refill takes less than 2 seconds.     Findings: Rash present.     Comments: Extensive yeast dermatitis rash to both hands and feet Onychomycosis  of all fingernails and toenails  Neurological:     General: No focal deficit present.     Mental Status: She is alert and oriented to person, place, and time.  Psychiatric:        Mood and Affect: Mood normal.        Behavior: Behavior normal.    Results for  orders placed or performed in visit on 01/20/24 (from the past 24 hours)  POCT HgB A1C     Status: Abnormal   Collection Time: 01/20/24 11:13 AM  Result Value Ref Range   Hemoglobin A1C 5.8 (A) 4.0 - 5.6 %   HbA1c POC (<> result, manual entry)     HbA1c, POC (prediabetic range)     HbA1c, POC (controlled diabetic range)        ASSESSMENT & PLAN: A total of 44 minutes was spent with the patient and counseling/coordination of care regarding preparing for this visit, review of most recent office visit notes, review of multiple chronic medical conditions and their management, cardiovascular risks associated with hypertension and diabetes, diagnosis of extensive skin yeast dermatitis and onychomycosis and need for evaluation by infectious diseases specialist, review of all medications, review of most recent bloodwork results including interpretation of today's hemoglobin A1c, review of health maintenance items, education on nutrition, prognosis, documentation, and need for follow up.  Problem List Items Addressed This Visit       Cardiovascular and Mediastinum   Hypertension associated with diabetes (HCC) - Primary   Well-controlled hypertension Continue amlodipine  5 mg, valsartan  320 mg, and nebivolol  10 mg daily Cardiovascular risks associated with hypertension discussed Well-controlled diabetes with hemoglobin A1c of 5.8 Continue metformin 500 mg twice a day, Januvia  100 mg daily and start Farxiga 10 mg daily for kidney protection Diet and nutrition discussed Blood work done today Follow-up in 6 months      Relevant Medications   FARXIGA 10 MG TABS tablet   Other Relevant Orders   POCT HgB A1C (Completed)   Comprehensive metabolic panel with GFR   CBC with Differential/Platelet   Lipid panel     Endocrine   Dyslipidemia associated with type 2 diabetes mellitus (HCC)   Diet and nutrition discussed Lipid profile done today Continue metformin and Januvia  Continue simvastatin  40  mg daily      Relevant Medications   FARXIGA 10 MG TABS tablet   Other Relevant Orders   POCT HgB A1C (Completed)   Comprehensive metabolic panel with GFR   CBC with Differential/Platelet   Lipid panel   Hypothyroidism   Clinically euthyroid TSH done today Continue Synthroid  50 mcg daily      Relevant Orders   TSH     Musculoskeletal and Integument   Yeast dermatitis   Extensive skin yeast dermatitis with also onychomycosis of all fingernails and toenails.  Unresponsive to topical treatments.  Active and affecting quality of life.  Recommend evaluation by infectious diseases.  Referral placed today.       Relevant Orders   Ambulatory referral to Infectious Disease     Genitourinary   Stage 3a chronic kidney disease (HCC)   Chronic stable condition Advised to stay well-hydrated and avoid NSAIDs Recommend to start Farxiga 10 mg daily for kidney protection.      Relevant Medications   FARXIGA 10 MG TABS tablet   Other Visit Diagnoses       Screening for colon cancer       Relevant Orders   Ambulatory referral to Gastroenterology  Osteoporosis screening       Relevant Orders   DG Bone Density        Patient Instructions  Diabetes Mellitus and Nutrition, Adult When you have diabetes, or diabetes mellitus, it is very important to have healthy eating habits because your blood sugar (glucose) levels are greatly affected by what you eat and drink. Eating healthy foods in the right amounts, at about the same times every day, can help you: Manage your blood glucose. Lower your risk of heart disease. Improve your blood pressure. Reach or maintain a healthy weight. What can affect my meal plan? Every person with diabetes is different, and each person has different needs for a meal plan. Your health care provider may recommend that you work with a dietitian to make a meal plan that is best for you. Your meal plan may vary depending on factors such as: The calories you  need. The medicines you take. Your weight. Your blood glucose, blood pressure, and cholesterol levels. Your activity level. Other health conditions you have, such as heart or kidney disease. How do carbohydrates affect me? Carbohydrates, also called carbs, affect your blood glucose level more than any other type of food. Eating carbs raises the amount of glucose in your blood. It is important to know how many carbs you can safely have in each meal. This is different for every person. Your dietitian can help you calculate how many carbs you should have at each meal and for each snack. How does alcohol affect me? Alcohol can cause a decrease in blood glucose (hypoglycemia), especially if you use insulin  or take certain diabetes medicines by mouth. Hypoglycemia can be a life-threatening condition. Symptoms of hypoglycemia, such as sleepiness, dizziness, and confusion, are similar to symptoms of having too much alcohol. Do not drink alcohol if: Your health care provider tells you not to drink. You are pregnant, may be pregnant, or are planning to become pregnant. If you drink alcohol: Limit how much you have to: 0-1 drink a day for women. 0-2 drinks a day for men. Know how much alcohol is in your drink. In the [place] one drink equals one 12 oz bottle of beer (355 mL), one 5 oz glass of wine (148 mL), or one 1 oz glass of hard liquor (44 mL). Keep yourself hydrated with water, diet soda, or unsweetened iced tea. Keep in mind that regular soda, juice, and other mixers may contain a lot of sugar and must be counted as carbs. What are tips for following this plan?  Reading food labels Start by checking the serving size on the Nutrition Facts label of packaged foods and drinks. The number of calories and the amount of carbs, fats, and other nutrients listed on the label are based on one serving of the item. Many items contain more than one serving per package. Check the total grams (g) of carbs in one  serving. Check the number of grams of saturated fats and trans fats in one serving. Choose foods that have a low amount or none of these fats. Check the number of milligrams (mg) of salt (sodium) in one serving. Most people should limit total sodium intake to less than 2,300 mg per day. Always check the nutrition information of foods labeled as "low-fat" or "nonfat." These foods may be higher in added sugar or refined carbs and should be avoided. Talk to your dietitian to identify your daily goals for nutrients listed on the label. Shopping Avoid buying canned, pre-made, or processed  foods. These foods tend to be high in fat, sodium, and added sugar. Shop around the outside edge of the grocery store. This is where you will most often find fresh fruits and vegetables, bulk grains, fresh meats, and fresh dairy products. Cooking Use low-heat cooking methods, such as baking, instead of high-heat cooking methods, such as deep frying. Cook using healthy oils, such as olive, canola, or sunflower oil. Avoid cooking with butter, cream, or high-fat meats. Meal planning Eat meals and snacks regularly, preferably at the same times every day. Avoid going long periods of time without eating. Eat foods that are high in fiber, such as fresh fruits, vegetables, beans, and whole grains. Eat 4-6 oz (112-168 g) of lean protein each day, such as lean meat, chicken, fish, eggs, or tofu. One ounce (oz) (28 g) of lean protein is equal to: 1 oz (28 g) of meat, chicken, or fish. 1 egg.  cup (62 g) of tofu. Eat some foods each day that contain healthy fats, such as avocado, nuts, seeds, and fish. What foods should I eat? Fruits Berries. Apples. Oranges. Peaches. Apricots. Plums. Grapes. Mangoes. Papayas. Pomegranates. Kiwi. Cherries. Vegetables Leafy greens, including lettuce, spinach, kale, chard, collard greens, mustard greens, and cabbage. Beets. Cauliflower. Broccoli. Carrots. Green beans. Tomatoes. Peppers.  Onions. Cucumbers. Brussels sprouts. Grains Whole grains, such as whole-wheat or whole-grain bread, crackers, tortillas, cereal, and pasta. Unsweetened oatmeal. Quinoa. Brown or wild rice. Meats and other proteins Seafood. Poultry without skin. Lean cuts of poultry and beef. Tofu. Nuts. Seeds. Dairy Low-fat or fat-free dairy products such as milk, yogurt, and cheese. The items listed above may not be a complete list of foods and beverages you can eat and drink. Contact a dietitian for more information. What foods should I avoid? Fruits Fruits canned with syrup. Vegetables Canned vegetables. Frozen vegetables with butter or cream sauce. Grains Refined white flour and flour products such as bread, pasta, snack foods, and cereals. Avoid all processed foods. Meats and other proteins Fatty cuts of meat. Poultry with skin. Breaded or fried meats. Processed meat. Avoid saturated fats. Dairy Full-fat yogurt, cheese, or milk. Beverages Sweetened drinks, such as soda or iced tea. The items listed above may not be a complete list of foods and beverages you should avoid. Contact a dietitian for more information. Questions to ask a health care provider Do I need to meet with a certified diabetes care and education specialist? Do I need to meet with a dietitian? What number can I call if I have questions? When are the best times to check my blood glucose? Where to find more information: American Diabetes Association: diabetes.org Academy of Nutrition and Dietetics: eatright.[employer] of Diabetes and Digestive and Kidney Diseases: StageSync.si Association of Diabetes Care & Education Specialists: diabeteseducator.org Summary It is important to have healthy eating habits because your blood sugar (glucose) levels are greatly affected by what you eat and drink. It is important to use alcohol carefully. A healthy meal plan will help you manage your blood glucose and lower your risk of  heart disease. Your health care provider may recommend that you work with a dietitian to make a meal plan that is best for you. This information is not intended to replace advice given to you by your health care provider. Make sure you discuss any questions you have with your health care provider. Document Revised: 03/19/2020 Document Reviewed: 03/20/2020 Elsevier Patient Education  2024 Elsevier Inc.    Maryagnes Small, MD [facility]  [facility]

## 2024-01-20 NOTE — Assessment & Plan Note (Signed)
 Well-controlled hypertension Continue amlodipine  5 mg, valsartan  320 mg, and nebivolol  10 mg daily Cardiovascular risks associated with hypertension discussed Well-controlled diabetes with hemoglobin A1c of 5.8 Continue metformin 500 mg twice a day, Januvia  100 mg daily and start Farxiga 10 mg daily for kidney protection Diet and nutrition discussed Blood work done today Follow-up in 6 months

## 2024-01-24 ENCOUNTER — Other Ambulatory Visit: Payer: Self-pay | Admitting: Podiatry

## 2024-01-25 ENCOUNTER — Other Ambulatory Visit: Payer: Self-pay | Admitting: Radiology

## 2024-01-25 ENCOUNTER — Ambulatory Visit: Payer: Self-pay

## 2024-01-25 MED ORDER — ALBUTEROL SULFATE HFA 108 (90 BASE) MCG/ACT IN AERS: 2.0000 | INHALATION_SPRAY | Freq: Four times a day (QID) | RESPIRATORY_TRACT | 1 refills | Status: DC | PRN

## 2024-01-25 NOTE — Telephone Encounter (Signed)
 This RN made first attempt to connect with patient. No answer, unable to LVM, due to inactivated VMB. Routing for additional attempts.   Copied from CRM [id number]. Topic: Clinical - Prescription Issue >> Jan 25, 2024 11:09 AM Magdalene School wrote: Reason for CRM: Patient called to report that her pharmacy has been sending refill requests for seven medications that the patient has been out of for several weeks. However, the pharmacy has not received any response to those requests.  The patient did not have the names of the medications available at the time of the call but is requesting that someone contact the pharmacy directly to review and approve the pending refill requests.  Please follow up with the patient's pharmacy to ensure the necessary refills are processed promptly.

## 2024-01-25 NOTE — Telephone Encounter (Signed)
 Copied from CRM [id number]. Topic: Clinical - Prescription Issue >> Jan 25, 2024  2:13 PM Adaysia C wrote: Reason for CRM: Please follow up with Ursula(patients daughter) with an update after speaking with patients pharmacy [phone]

## 2024-01-25 NOTE — Telephone Encounter (Signed)
 Called mobile number listed under pt, it was her daughter who states pt phone is not working correctly, left number for pt to rtc. Routing to clinic.

## 2024-01-27 ENCOUNTER — Telehealth: Payer: Self-pay | Admitting: Emergency Medicine

## 2024-01-27 NOTE — Telephone Encounter (Signed)
 Copied from CRM [id number]. Topic: Clinical - Medication Question >> Jan 26, 2024  2:25 PM Aisha D wrote: Reason for CRM: Pt stated that [facility] has been trying to reach out regarding the medications that need to be refilled. Pt stated that the pharmacy stated they have sent the request over multiple times and no one from the office has responded. Pt would like to have the medications approved today if possible and would like a call back with an update.

## 2024-01-27 NOTE — Telephone Encounter (Signed)
 Spoke with patient and informed her her medications has been sent to [facility] not [facility] and she needs to contact [facility] to see where it is or give us  a call back to have it all sent to [facility] . She understood

## 2024-02-04 ENCOUNTER — Telehealth: Payer: Self-pay | Admitting: Emergency Medicine

## 2024-02-04 NOTE — Telephone Encounter (Signed)
 Spoke with Rep and confirmed Dm dx

## 2024-02-04 NOTE — Telephone Encounter (Signed)
 Copied from CRM [id number]. Topic: General - Other >> Feb 04, 2024 11:29 AM Alyse July wrote: Reason for CRM: [employer] would like to a call back to verify if patient has diabetes and cardiovascular disease. Reference:[id number] Call Back: [phone]

## 2024-02-15 ENCOUNTER — Ambulatory Visit: Admitting: Internal Medicine

## 2024-02-15 ENCOUNTER — Other Ambulatory Visit: Payer: Self-pay

## 2024-02-15 ENCOUNTER — Encounter: Payer: Self-pay | Admitting: Internal Medicine

## 2024-02-15 ENCOUNTER — Telehealth: Payer: Self-pay | Admitting: Emergency Medicine

## 2024-02-15 VITALS — BP 106/67 | Temp 97.7°F | Ht 64.0 in | Wt 164.0 lb

## 2024-02-15 DIAGNOSIS — L401 Generalized pustular psoriasis: Secondary | ICD-10-CM

## 2024-02-15 DIAGNOSIS — L409 Psoriasis, unspecified: Secondary | ICD-10-CM

## 2024-02-15 NOTE — Progress Notes (Unsigned)
 Patient Active Problem List   Diagnosis Date Noted   Yeast dermatitis 01/20/2024   Stage 3a chronic kidney disease (HCC) 01/20/2024   Dyslipidemia associated with type 2 diabetes mellitus (HCC) 07/06/2023   Hypothyroidism    Hypertension associated with diabetes (HCC) 04/04/2012    Patient's Medications  New Prescriptions   No medications on file  Previous Medications   ALBUTEROL  (PROVENTIL  HFA) 108 (90 BASE) MCG/ACT INHALER    Inhale 2 puffs into the lungs every 6 (six) hours as needed for wheezing or shortness of breath.   AMLODIPINE  (NORVASC ) 5 MG TABLET    Take 1 tablet (5 mg total) by mouth daily.   BLOOD GLUCOSE MONITORING SUPPL (TRUE METRIX AIR GLUCOSE METER) W/DEVICE KIT       CICLOPIROX (LOPROX) 0.77 % CREAM    SMARTSIG:1 Sparingly Topical Twice Daily PRN   CITALOPRAM (CELEXA) 40 MG TABLET    Take 40 mg by mouth daily.   CLOBETASOL  OINTMENT (TEMOVATE ) 0.05 %    Apply topically 2 (two) times daily.   COLCHICINE 0.6 MG TABLET    Take 0.6 mg by mouth 2 (two) times daily as needed (gout).   DICYCLOMINE  (BENTYL ) 20 MG TABLET    Take 1 tablet (20 mg total) by mouth 2 (two) times daily.   DIPHENHYDRAMINE (BENADRYL) 25 MG TABLET    Take 50 mg by mouth daily as needed for allergies.   DIPHENHYDRAMINE (BENADRYL) 25 MG TABLET    Take 25 mg by mouth every 6 (six) hours as needed for allergies.   DIPHENOXYLATE-ATROPINE (LOMOTIL) 2.5-0.025 MG TABLET    Take by mouth 4 (four) times daily as needed for diarrhea or loose stools.   FARXIGA  10 MG TABS TABLET    Take 1 tablet (10 mg total) by mouth daily before breakfast.   FENOFIBRATE  160 MG TABLET    Take 1 tablet (160 mg total) by mouth daily.   GABAPENTIN (NEURONTIN) 300 MG CAPSULE    Take 300 mg by mouth 2 (two) times daily.   LEVOFLOXACIN (LEVAQUIN) 500 MG TABLET    levofloxacin 500 mg tablet  TAKE 1 TABLET BY MOUTH EVERY 24 HOURS FOR 7 DAYS   LEVOTHYROXINE  (SYNTHROID ) 50 MCG TABLET    TAKE 1 TABLET BY MOUTH DAILY BEFORE  BREAKFAST   LOPERAMIDE  (IMODIUM ) 2 MG CAPSULE    Take 1 capsule (2 mg total) by mouth 4 (four) times daily as needed for diarrhea or loose stools.   LOSARTAN (COZAAR) 50 MG TABLET    Take 50 mg by mouth daily.   METFORMIN (GLUCOPHAGE) 500 MG TABLET    Take 500 mg by mouth 2 (two) times daily with a meal.   METHOCARBAMOL (ROBAXIN) 750 MG TABLET    Take 750 mg by mouth 4 (four) times daily.   METHYLPREDNISOLONE  (MEDROL  DOSEPAK) 4 MG TBPK TABLET    6 day dose pack - take as directed   NALOXONE (NARCAN) NASAL SPRAY 4 MG/0.1 ML       NEBIVOLOL  (BYSTOLIC ) 10 MG TABLET    Take 1 tablet (10 mg total) by mouth daily.   OMEPRAZOLE  (PRILOSEC) 20 MG CAPSULE    Take 1 capsule (20 mg total) by mouth daily.   POTASSIUM CHLORIDE  (KLOR-CON  M) 10 MEQ TABLET    Take 1 tablet (10 mEq total) by mouth 2 (two) times daily.   POTASSIUM CHLORIDE  (KLOR-CON ) 10 MEQ TABLET    Take by mouth.   PREGABALIN  (LYRICA ) 75 MG  CAPSULE    Take 1 capsule (75 mg total) by mouth 2 (two) times daily.   ROSUVASTATIN (CRESTOR) 10 MG TABLET       SIMVASTATIN  (ZOCOR ) 40 MG TABLET    Take 1 tablet (40 mg total) by mouth daily.   SITAGLIPTIN  (JANUVIA ) 100 MG TABLET    Take 1 tablet (100 mg total) by mouth daily.   TACROLIMUS  (PROTOPIC ) 0.1 % OINTMENT    APPLY TO AFFECTED AREA TOPICALLY EVERY DAY   TRAZODONE  (DESYREL ) 50 MG TABLET    Take 100 mg by mouth at bedtime.   TRUE METRIX BLOOD GLUCOSE TEST TEST STRIP       VALSARTAN  (DIOVAN ) 320 MG TABLET    Take 320 mg by mouth daily.  Modified Medications   No medications on file  Discontinued Medications   No medications on file    Subjective: ***  Today @DATE @ : Discussed the use of AI scribe software for clinical note transcription with the patient, who gave verbal consent to proceed.  History of Present Illness     Review of Systems: ROS  Past Medical History:  Diagnosis Date   Arthritis    Asthma    Dementia (HCC)    Depression    Diabetes mellitus    type II   GERD  (gastroesophageal reflux disease)    Gout    Hyperlipemia    Hyperparathyroidism (HCC)    Hypertension    Hypothyroidism    IBS (irritable bowel syndrome)    diarrhea   Thyroid disease     Social History   Tobacco Use   Smoking status: Former    Current packs/day: 0.00    Average packs/day: 0.3 packs/day for 15.0 years (4.5 ttl pk-yrs)    Types: Cigarettes    Start date: 03/31/1996    Quit date: 04/01/2011    Years since quitting: 12.8   Smokeless tobacco: Never  Vaping Use   Vaping status: Never Used  Substance Use Topics   Alcohol use: No   Drug use: No    Family History  Problem Relation Age of Onset   Diabetes Mother    Asthma Father    Allergies Father    Allergies Brother    Hyperlipidemia Other    Breast cancer Neg Hx    Colon cancer Neg Hx    Stomach cancer Neg Hx     Allergies  Allergen Reactions   Ibuprofen Other (See Comments)    Gasteric issues   Tetracyclines & Related Other (See Comments)    Builds up fluid in the brain   Tetracyclines & Related Other (See Comments)    Demeclocycline-Fluid on the brain    Health Maintenance  Topic Date Due   Medicare Annual Wellness (AWV)  Never done   FOOT EXAM  Never done   OPHTHALMOLOGY EXAM  Never done   Hepatitis C Screening  Never done   Pneumococcal Vaccine: 50+ Years (1 of 2 - PCV) Never done   Colonoscopy  Never done   DEXA SCAN  Never done   COVID-19 Vaccine (4 - 2024-25 season) 05/02/2023   INFLUENZA VACCINE  03/31/2024   Diabetic kidney evaluation - Urine ACR  07/05/2024   HEMOGLOBIN A1C  07/22/2024   Diabetic kidney evaluation - eGFR measurement  01/19/2025   MAMMOGRAM  09/15/2025   DTaP/Tdap/Td (2 - Td or Tdap) 02/11/2029   Zoster Vaccines- Shingrix  Completed   HPV VACCINES  Aged Out   Meningococcal B Vaccine  Aged Out  Objective:  Vitals:   02/15/24 0904  Weight: 164 lb (74.4 kg)  Height: 5' 4 (1.626 m)   Body mass index is 28.15 kg/m.  Physical Exam Physical Exam   Lab  Results Lab Results  Component Value Date   WBC 8.3 01/20/2024   HGB 11.9 (L) 01/20/2024   HCT 36.3 01/20/2024   MCV 86.1 01/20/2024   PLT 226.0 01/20/2024    Lab Results  Component Value Date   CREATININE 1.10 01/20/2024   BUN 12 01/20/2024   NA 142 01/20/2024   K 3.5 01/20/2024   CL 107 01/20/2024   CO2 30 01/20/2024    Lab Results  Component Value Date   ALT 7 01/20/2024   AST 10 01/20/2024   ALKPHOS 39 01/20/2024   BILITOT 0.3 01/20/2024    Lab Results  Component Value Date   CHOL 161 01/20/2024   HDL 51.40 01/20/2024   LDLCALC 92 01/20/2024   TRIG 89.0 01/20/2024   CHOLHDL 3 01/20/2024   No results found for: LABRPR, RPRTITER No results found for: HIV1RNAQUANT, HIV1RNAVL, CD4TABS   Problem List Items Addressed This Visit   None  Results   Assessment/Plan #Pustular psoriais -Seen by Dermotology, Payton Both, MD at [facility] spril 2025.  -No biopsy bu gave creams that did not work -[employer] dnot work - Record release  F/u 2 weeks   Orlie Bjornstad, MD [employer] [facility] 02/15/2024, 9:06 AM

## 2024-02-15 NOTE — Telephone Encounter (Unsigned)
 Copied from CRM [id number]. Topic: Clinical - Medication Question >> Feb 15, 2024 11:41 AM Precious C wrote: Reason for CRM: Pt called in regards to medication being prescribed to her for pain, pt says she unable to walk due to the pain and is requesting pain medication, also pt went to [facility] today 02/15/24 and they stated they could not prescribe pain meds.

## 2024-02-16 ENCOUNTER — Other Ambulatory Visit: Payer: Self-pay | Admitting: Emergency Medicine

## 2024-02-16 MED ORDER — TRAMADOL HCL 50 MG PO TABS: 50.0000 mg | ORAL_TABLET | Freq: Three times a day (TID) | ORAL | 1 refills | Status: AC | PRN

## 2024-02-16 NOTE — Telephone Encounter (Signed)
New prescription for tramadol sent to pharmacy of record today.  Thanks.

## 2024-03-04 ENCOUNTER — Other Ambulatory Visit: Payer: Self-pay | Admitting: Emergency Medicine

## 2024-03-09 ENCOUNTER — Ambulatory Visit: Admitting: Internal Medicine

## 2024-03-20 ENCOUNTER — Other Ambulatory Visit: Payer: Self-pay | Admitting: Emergency Medicine

## 2024-03-23 ENCOUNTER — Other Ambulatory Visit: Payer: Self-pay | Admitting: Emergency Medicine

## 2024-03-28 ENCOUNTER — Other Ambulatory Visit: Payer: Self-pay | Admitting: Emergency Medicine

## 2024-03-30 ENCOUNTER — Other Ambulatory Visit: Payer: Self-pay | Admitting: Emergency Medicine

## 2024-04-03 ENCOUNTER — Other Ambulatory Visit: Payer: Self-pay | Admitting: Emergency Medicine

## 2024-04-03 DIAGNOSIS — E782 Mixed hyperlipidemia: Secondary | ICD-10-CM | POA: Diagnosis not present

## 2024-04-03 DIAGNOSIS — I10 Essential (primary) hypertension: Secondary | ICD-10-CM | POA: Diagnosis not present

## 2024-04-03 DIAGNOSIS — I25118 Atherosclerotic heart disease of native coronary artery with other forms of angina pectoris: Secondary | ICD-10-CM | POA: Diagnosis not present

## 2024-04-03 DIAGNOSIS — E119 Type 2 diabetes mellitus without complications: Secondary | ICD-10-CM | POA: Diagnosis not present

## 2024-04-10 ENCOUNTER — Ambulatory Visit (INDEPENDENT_AMBULATORY_CARE_PROVIDER_SITE_OTHER)

## 2024-04-10 VITALS — Ht 64.0 in | Wt 164.0 lb

## 2024-04-10 DIAGNOSIS — Z1159 Encounter for screening for other viral diseases: Secondary | ICD-10-CM | POA: Diagnosis not present

## 2024-04-10 DIAGNOSIS — Z780 Asymptomatic menopausal state: Secondary | ICD-10-CM

## 2024-04-10 DIAGNOSIS — E1169 Type 2 diabetes mellitus with other specified complication: Secondary | ICD-10-CM

## 2024-04-10 DIAGNOSIS — K635 Polyp of colon: Secondary | ICD-10-CM

## 2024-04-10 DIAGNOSIS — Z0000 Encounter for general adult medical examination without abnormal findings: Secondary | ICD-10-CM

## 2024-04-10 DIAGNOSIS — E109 Type 1 diabetes mellitus without complications: Secondary | ICD-10-CM

## 2024-04-10 NOTE — Progress Notes (Signed)
 Subjective:   Alejandra Duffy is a 73 y.o. who presents for a Medicare Wellness preventive visit.  As a reminder, Annual Wellness Visits don't include a physical exam, and some assessments may be limited, especially if this visit is performed virtually. We may recommend an in-person follow-up visit with your provider if needed.  Visit Complete: Virtual I connected with  Alejandra Duffy on 04/10/24 by a audio enabled telemedicine application and verified that I am speaking with the correct person using two identifiers.  Patient Location: Home  Provider Location: Office/Clinic  I discussed the limitations of evaluation and management by telemedicine. The patient expressed understanding and agreed to proceed.  Vital Signs: Because this visit was a virtual/telehealth visit, some criteria may be missing or patient reported. Any vitals not documented were not able to be obtained and vitals that have been documented are patient reported.  VideoDeclined- This patient declined Librarian, academic. Therefore the visit was completed with audio only.  Persons Participating in Visit: Patient.  AWV Questionnaire: No: Patient Medicare AWV questionnaire was not completed prior to this visit.  Cardiac Risk Factors include: advanced age (>62men, >86 women);diabetes mellitus;hypertension;dyslipidemia     Objective:    Today's Vitals   04/10/24 1431  Weight: 164 lb (74.4 kg)  Height: 5' 4 (1.626 m)   Body mass index is 28.15 kg/m.     04/10/2024    2:31 PM 04/16/2022    2:14 PM 03/27/2021    2:09 PM 02/11/2019   10:39 PM 05/10/2018    7:59 AM 04/20/2017    6:23 AM  Advanced Directives  Does Patient Have a Medical Advance Directive? Yes No No No No  No   Type of Estate agent of [place];Living will       Copy of Healthcare Power of Attorney in Chart? No - copy requested       Would patient like information on creating a medical  advance directive?  No - Patient declined Yes (MAU/Ambulatory/Procedural Areas - Information given)  No - Patient declined  No - Patient declined      Data saved with a previous flowsheet row definition    Current Medications (verified) Outpatient Encounter Medications as of 04/10/2024  Medication Sig   albuterol  (VENTOLIN  HFA) 108 (90 Base) MCG/ACT inhaler TAKE 2 PUFFS BY MOUTH EVERY 6 HOURS AS NEEDED FOR WHEEZE OR SHORTNESS OF BREATH   amLODipine  (NORVASC ) 5 MG tablet Take 1 tablet (5 mg total) by mouth daily.   Blood Glucose Monitoring Suppl (TRUE METRIX AIR GLUCOSE METER) w/Device KIT    ciclopirox (LOPROX) 0.77 % cream SMARTSIG:1 Sparingly Topical Twice Daily PRN   citalopram (CELEXA) 40 MG tablet Take 40 mg by mouth daily.   clobetasol  ointment (TEMOVATE ) 0.05 % Apply topically 2 (two) times daily.   dicyclomine  (BENTYL ) 20 MG tablet Take 1 tablet (20 mg total) by mouth 2 (two) times daily.   diphenhydrAMINE (BENADRYL) 25 MG tablet Take 25 mg by mouth every 6 (six) hours as needed for allergies.   diphenoxylate-atropine (LOMOTIL) 2.5-0.025 MG tablet Take by mouth 4 (four) times daily as needed for diarrhea or loose stools.   FARXIGA  10 MG TABS tablet Take 1 tablet (10 mg total) by mouth daily before breakfast.   fenofibrate  160 MG tablet Take 1 tablet (160 mg total) by mouth daily.   gabapentin (NEURONTIN) 300 MG capsule Take 300 mg by mouth 2 (two) times daily.   levothyroxine  (SYNTHROID ) 50 MCG tablet TAKE  1 TABLET BY MOUTH DAILY BEFORE BREAKFAST   loperamide  (IMODIUM ) 2 MG capsule Take 1 capsule (2 mg total) by mouth 4 (four) times daily as needed for diarrhea or loose stools.   losartan (COZAAR) 50 MG tablet Take 50 mg by mouth daily.   metFORMIN (GLUCOPHAGE) 500 MG tablet Take 500 mg by mouth 2 (two) times daily with a meal.   methocarbamol (ROBAXIN) 750 MG tablet Take 750 mg by mouth 4 (four) times daily.   naloxone (NARCAN) nasal spray 4 mg/0.1 mL    nebivolol  (BYSTOLIC ) 10 MG  tablet Take 1 tablet (10 mg total) by mouth daily. (Patient taking differently: Take 10 mg by mouth daily. Patient is taking 5 mg)   omeprazole  (PRILOSEC) 20 MG capsule Take 1 capsule (20 mg total) by mouth daily.   potassium chloride  (KLOR-CON  M) 10 MEQ tablet TAKE 1 TABLET TWICE DAILY   potassium chloride  (KLOR-CON ) 10 MEQ tablet Take by mouth.   pregabalin  (LYRICA ) 75 MG capsule Take 1 capsule (75 mg total) by mouth 2 (two) times daily.   simvastatin  (ZOCOR ) 40 MG tablet Take 1 tablet (40 mg total) by mouth daily.   sitaGLIPtin  (JANUVIA ) 100 MG tablet Take 1 tablet (100 mg total) by mouth daily.   tacrolimus  (PROTOPIC ) 0.1 % ointment APPLY TO AFFECTED AREA TOPICALLY EVERY DAY   traZODone  (DESYREL ) 50 MG tablet TAKE 100 MG BY MOUTH AT BEDTIME.   TRUE METRIX BLOOD GLUCOSE TEST test strip    valsartan  (DIOVAN ) 320 MG tablet Take 320 mg by mouth daily.   colchicine 0.6 MG tablet Take 0.6 mg by mouth 2 (two) times daily as needed (gout). (Patient not taking: Reported on 04/10/2024)   diphenhydrAMINE (BENADRYL) 25 MG tablet Take 50 mg by mouth daily as needed for allergies.   levofloxacin (LEVAQUIN) 500 MG tablet levofloxacin 500 mg tablet  TAKE 1 TABLET BY MOUTH EVERY 24 HOURS FOR 7 DAYS (Patient not taking: Reported on 04/10/2024)   methylPREDNISolone  (MEDROL  DOSEPAK) 4 MG TBPK tablet 6 day dose pack - take as directed (Patient not taking: Reported on 04/10/2024)   rosuvastatin (CRESTOR) 10 MG tablet  (Patient not taking: Reported on 04/10/2024)   No facility-administered encounter medications on file as of 04/10/2024.    Allergies (verified) Ibuprofen, Tetracyclines & related, and Tetracyclines & related   History: Past Medical History:  Diagnosis Date   Arthritis    Asthma    Dementia (HCC)    Depression    Diabetes mellitus    type II   GERD (gastroesophageal reflux disease)    Gout    Hyperlipemia    Hyperparathyroidism (HCC)    Hypertension    Hypothyroidism    IBS (irritable  bowel syndrome)    diarrhea   Thyroid disease    Past Surgical History:  Procedure Laterality Date   CHOLECYSTECTOMY     COLONOSCOPY  2013   DR Medoff   LEFT HEART CATH AND CORONARY ANGIOGRAPHY N/A 04/20/2017   Procedure: LEFT HEART CATH AND CORONARY ANGIOGRAPHY;  Surgeon: Levern Hutching, MD;  Location: [facility];  Service: Cardiovascular;  Laterality: N/A;   TOOTH EXTRACTION Bilateral 05/10/2018   Procedure: DENTAL RESTORATION/EXTRACTIONS;  Surgeon: Sheryle Hamilton, DDS;  Location: [facility] OR;  Service: Oral Surgery;  Laterality: Bilateral;   VESICOVAGINAL FISTULA CLOSURE W/ TAH     Family History  Problem Relation Age of Onset   Diabetes Mother    Asthma Father    Allergies Father    Allergies Brother  Hyperlipidemia Other    Breast cancer Neg Hx    Colon cancer Neg Hx    Stomach cancer Neg Hx    Social History   Socioeconomic History   Marital status: Divorced    Spouse name: Not on file   Number of children: 2   Years of education: Not on file   Highest education level: Not on file  Occupational History   Occupation: Disabled    Employer: UNEMPLOYED  Tobacco Use   Smoking status: Former    Current packs/day: 0.00    Average packs/day: 0.3 packs/day for 15.0 years (4.5 ttl pk-yrs)    Types: Cigarettes    Start date: 03/31/1996    Quit date: 04/01/2011    Years since quitting: 13.0   Smokeless tobacco: Never  Vaping Use   Vaping status: Never Used  Substance and Sexual Activity   Alcohol use: No   Drug use: No   Sexual activity: Not Currently  Other Topics Concern   Not on file  Social History Narrative   Not on file   Social Drivers of Health   Financial Resource Strain: Low Risk  (04/10/2024)   Overall Financial Resource Strain (CARDIA)    Difficulty of Paying Living Expenses: Not hard at all  Food Insecurity: No Food Insecurity (04/10/2024)   Hunger Vital Sign    Worried About Running Out of Food in the Last Year: Never true    Ran Out of Food in the  Last Year: Never true  Transportation Needs: No Transportation Needs (04/10/2024)   PRAPARE - Administrator, Civil Service (Medical): No    Lack of Transportation (Non-Medical): No  Physical Activity: Insufficiently Active (04/10/2024)   Exercise Vital Sign    Days of Exercise per Week: 3 days    Minutes of Exercise per Session: 30 min  Stress: Stress Concern Present (04/10/2024)   [employer] of Occupational Health - Occupational Stress Questionnaire    Feeling of Stress: To some extent  Social Connections: Moderately Integrated (04/10/2024)   Social Connection and Isolation Panel    Frequency of Communication with Friends and Family: More than three times a week    Frequency of Social Gatherings with Friends and Family: More than three times a week    Attends Religious Services: More than 4 times per year    Active Member of [employer] or Organizations: Yes    Attends Banker Meetings: Never    Marital Status: Divorced    Tobacco Counseling Counseling given: No    Clinical Intake:  Pre-visit preparation completed: Yes  Pain : No/denies pain     BMI - recorded: 28.15 Nutritional Status: BMI 25 -29 Overweight Nutritional Risks: None Diabetes: Yes CBG done?: No Did pt. bring in CBG monitor from home?: No  Lab Results  Component Value Date   HGBA1C 5.8 (A) 01/20/2024   HGBA1C 7.1 (H) 07/06/2023   HGBA1C 6.4 (H) 05/10/2018     How often do you need to have someone help you when you read instructions, pamphlets, or other written materials from your doctor or pharmacy?: 1 - Never  Interpreter Needed?: No  Information entered by :: Verdie Saba, CMA   Activities of Daily Living     04/10/2024    2:37 PM  In your present state of health, do you have any difficulty performing the following activities:  Hearing? 0  Vision? 0  Difficulty concentrating or making decisions? 0  Walking or climbing stairs? 0  Dressing or bathing? 0  Doing  errands, shopping? 0  Preparing Food and eating ? N  Using the Toilet? N  In the past six months, have you accidently leaked urine? N  Do you have problems with loss of bowel control? Y  Comment wears a pad  Managing your Medications? N  Managing your Finances? N  Housekeeping or managing your Housekeeping? N    Patient Care Team: Purcell Emil Schanz, MD as PCP - General (Internal Medicine) Levern Hutching, MD (Internal Medicine) [facility], Juliene SAUNDERS, DPM as Consulting Physician (Podiatry)  I have updated your Care Teams any recent Medical Services you may have received from other providers in the past year.     Assessment:   This is a routine wellness examination for Betrice.  Hearing/Vision screen Hearing Screening - Comments:: Denies hearing difficulties   Vision Screening - Comments:: Wears rx glasses - up to date with routine eye exams with [facility]   Goals Addressed               This Visit's Progress     Patient Stated (pt-stated)        Patient stated she's been focused on the healing process of her hands/feet       Depression Screen     04/10/2024    2:39 PM 01/20/2024   10:49 AM 07/06/2023    3:33 PM 03/27/2021    2:10 PM  PHQ 2/9 Scores  PHQ - 2 Score 0 0 0 0  PHQ- 9 Score 0       Fall Risk     04/10/2024    2:38 PM 01/20/2024   10:49 AM 07/06/2023    3:33 PM 03/27/2021    2:10 PM  Fall Risk   Falls in the past year? 1  0 0  Number falls in past yr: 0  0 0  Comment 1     Injury with Fall? 0 0 0   Follow up Falls evaluation completed;Falls prevention discussed       MEDICARE RISK AT HOME:  Medicare Risk at Home Any stairs in or around the home?: No If so, are there any without handrails?: No Home free of loose throw rugs in walkways, pet beds, electrical cords, etc?: Yes Adequate lighting in your home to reduce risk of falls?: Yes Life alert?: No Use of a cane, walker or w/c?: No Grab bars in the bathroom?:  Yes Shower chair or bench in shower?: No Elevated toilet seat or a handicapped toilet?: No  TIMED UP AND GO:  Was the test performed?  No  Cognitive Function: 6CIT completed        04/10/2024    2:42 PM  6CIT Screen  What Year? 0 points  What month? 0 points  What time? 0 points  Count back from 20 0 points  Months in reverse 0 points  Repeat phrase 0 points  Total Score 0 points    Immunizations Immunization History  Administered Date(s) Administered   Influenza Whole 06/07/2012   PFIZER(Purple Top)SARS-COV-2 Vaccination 10/29/2019, 11/28/2019, 09/23/2020   Tdap 02/12/2019    Screening Tests Health Maintenance  Topic Date Due   FOOT EXAM  Never done   OPHTHALMOLOGY EXAM  Never done   Diabetic kidney evaluation - Urine ACR  Never done   Hepatitis C Screening  Never done   Pneumococcal Vaccine: 50+ Years (1 of 2 - PCV) Never done   Colonoscopy  Never done  Zoster Vaccines- Shingrix (1 of 2) Never done   DEXA SCAN  Never done   COVID-19 Vaccine (4 - 2024-25 season) 05/02/2023   INFLUENZA VACCINE  03/31/2024   HEMOGLOBIN A1C  07/22/2024   Diabetic kidney evaluation - eGFR measurement  01/19/2025   Medicare Annual Wellness (AWV)  04/10/2025   MAMMOGRAM  09/15/2025   DTaP/Tdap/Td (2 - Td or Tdap) 02/11/2029   Hepatitis B Vaccines  Aged Out   HPV VACCINES  Aged Out   Meningococcal B Vaccine  Aged Out    Health Maintenance  Health Maintenance Due  Topic Date Due   FOOT EXAM  Never done   OPHTHALMOLOGY EXAM  Never done   Diabetic kidney evaluation - Urine ACR  Never done   Hepatitis C Screening  Never done   Pneumococcal Vaccine: 50+ Years (1 of 2 - PCV) Never done   Colonoscopy  Never done   Zoster Vaccines- Shingrix (1 of 2) Never done   DEXA SCAN  Never done   COVID-19 Vaccine (4 - 2024-25 season) 05/02/2023   INFLUENZA VACCINE  03/31/2024   Health Maintenance Items Addressed:  DEXA ordered, Referral sent to GI for colonoscopy, Diabetic Foot Exam  recommended, Labs Ordered: Hepatitis C Screening and Diabetic Kidney Urine ACR  Additional Screening:  Vision Screening: Recommended annual ophthalmology exams for early detection of glaucoma and other disorders of the eye. Would you like a referral to an eye doctor? No  Patient plans to contact [facility] for a Diabetic eye exam for 2025.  Dental Screening: Recommended annual dental exams for proper oral hygiene  Community Resource Referral / Chronic Care Management: CRR required this visit?  No   CCM required this visit?  No   Plan:    I have personally reviewed and noted the following in the patient's chart:   Medical and social history Use of alcohol, tobacco or illicit drugs  Current medications and supplements including opioid prescriptions. Patient is not currently taking opioid prescriptions. Functional ability and status Nutritional status Physical activity Advanced directives List of other physicians Hospitalizations, surgeries, and ER visits in previous 12 months Vitals Screenings to include cognitive, depression, and falls Referrals and appointments  In addition, I have reviewed and discussed with patient certain preventive protocols, quality metrics, and best practice recommendations. A written personalized care plan for preventive services as well as general preventive health recommendations were provided to patient.   Verdie CHRISTELLA Saba, CMA   04/10/2024   After Visit Summary: (MyChart) Due to this being a telephonic visit, the after visit summary with patients personalized plan was offered to patient via MyChart   Notes: Scheduled a 3-mth Diabetic f/u w/PCP for 04/24/2024.

## 2024-04-10 NOTE — Patient Instructions (Signed)
 Ms. Shampine , Thank you for taking time out of your busy schedule to complete your Annual Wellness Visit with me. I enjoyed our conversation and look forward to speaking with you again next year. I, as well as your care team,  appreciate your ongoing commitment to your health goals. Please review the following plan we discussed and let me know if I can assist you in the future. Your Game plan/ To Do List    Referrals: If you haven't heard from the office you've been referred to, please reach out to them at the phone provided. DEXA ordered, Referral sent to GI for colonoscopy, Diabetic Foot Exam recommended, Labs Ordered: Hepatitis C Screening and Diabetic Kidney Urine ACR  Follow up Visits: We will see or speak with you next year for your Next Medicare AWV with our clinical staff Have you seen your provider in the last 6 months (3 months if uncontrolled diabetes)? Yes  Clinician Recommendations:  Aim for 30 minutes of exercise or brisk walking, 6-8 glasses of water, and 5 servings of fruits and vegetables each day. Educated and advised on getting the Shingles vaccines in 2025.      This is a list of the screenings recommended for you:  Health Maintenance  Topic Date Due   Complete foot exam   Never done   Eye exam for diabetics  Never done   Yearly kidney health urinalysis for diabetes  Never done   Hepatitis C Screening  Never done   Pneumococcal Vaccine for age over 75 (1 of 2 - PCV) Never done   Colon Cancer Screening  Never done   DEXA scan (bone density measurement)  Never done   COVID-19 Vaccine (4 - 2024-25 season) 05/02/2023   Flu Shot  03/31/2024   Hemoglobin A1C  07/22/2024   Yearly kidney function blood test for diabetes  01/19/2025   Medicare Annual Wellness Visit  04/10/2025   Mammogram  09/15/2025   DTaP/Tdap/Td vaccine (2 - Td or Tdap) 02/11/2029   Zoster (Shingles) Vaccine  Completed   Hepatitis B Vaccine  Aged Out   HPV Vaccine  Aged Out   Meningitis B Vaccine  Aged  Out    Advanced directives: (Copy Requested) Please bring a copy of your health care power of attorney and living will to the office to be added to your chart at your convenience. You can mail to [facility] [address] [place], [place] [address] or email to [email or web address] Advance Care Planning is important because it:  [x]  Makes sure you receive the medical care that is consistent with your values, goals, and preferences  [x]  It provides guidance to your family and loved ones and reduces their decisional burden about whether or not they are making the right decisions based on your wishes.  Follow the link provided in your after visit summary or read over the paperwork we have mailed to you to help you started getting your Advance Directives in place. If you need assistance in completing these, please reach out to us  so that we can help you!

## 2024-04-24 ENCOUNTER — Ambulatory Visit: Admitting: Emergency Medicine

## 2024-05-23 ENCOUNTER — Ambulatory Visit: Admitting: Physician Assistant

## 2024-06-11 ENCOUNTER — Other Ambulatory Visit: Payer: Self-pay | Admitting: Emergency Medicine

## 2024-06-28 ENCOUNTER — Other Ambulatory Visit: Payer: Self-pay | Admitting: Emergency Medicine

## 2024-06-29 ENCOUNTER — Other Ambulatory Visit: Payer: Self-pay | Admitting: Podiatry

## 2024-07-03 DIAGNOSIS — I25118 Atherosclerotic heart disease of native coronary artery with other forms of angina pectoris: Secondary | ICD-10-CM | POA: Diagnosis not present

## 2024-07-03 DIAGNOSIS — E782 Mixed hyperlipidemia: Secondary | ICD-10-CM | POA: Diagnosis not present

## 2024-07-03 DIAGNOSIS — E119 Type 2 diabetes mellitus without complications: Secondary | ICD-10-CM | POA: Diagnosis not present

## 2024-07-03 DIAGNOSIS — I10 Essential (primary) hypertension: Secondary | ICD-10-CM | POA: Diagnosis not present

## 2024-07-06 ENCOUNTER — Ambulatory Visit (INDEPENDENT_AMBULATORY_CARE_PROVIDER_SITE_OTHER): Admitting: Podiatry

## 2024-07-06 VITALS — Ht 64.0 in | Wt 164.0 lb

## 2024-07-06 DIAGNOSIS — L403 Pustulosis palmaris et plantaris: Secondary | ICD-10-CM | POA: Diagnosis not present

## 2024-07-06 DIAGNOSIS — Z0189 Encounter for other specified special examinations: Secondary | ICD-10-CM | POA: Diagnosis not present

## 2024-07-06 DIAGNOSIS — B351 Tinea unguium: Secondary | ICD-10-CM

## 2024-07-06 DIAGNOSIS — E119 Type 2 diabetes mellitus without complications: Secondary | ICD-10-CM | POA: Diagnosis not present

## 2024-07-06 NOTE — Progress Notes (Signed)
  Subjective:  Patient ID: Alejandra Duffy, female    DOB: [birth year],  MRN: [id number]  Chief Complaint  Patient presents with   Diabetes    Rm 7 DFC.  Patient states a tingling sensation in both feet.    73 y.o. female presents with the above complaint. History confirmed with patient.  She notes significant improvement with her psoriasis utilizing tacrolimus  ointment.  She continues to use this.  She is still interested in seeing a dermatologist at some point, her PCP office practice has someone that she says they can refer her to and she will discuss this with them.  Her nails are thickened and causing discomfort.  Her diabetes remains well-controlled.  Objective:  Physical Exam: warm, good capillary refill, no trophic changes or ulcerative lesions, normal DP and PT pulses, and normal sensory exam.  Psoriasis is stable and improved Left Foot: dystrophic yellowed discolored nail plates with subungual debris Right Foot: dystrophic yellowed discolored nail plates with subungual debris   Assessment:   1. Pain due to onychomycosis of toenails of both feet   2. 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  Patient educated on diabetes. Discussed proper diabetic foot care and discussed risks and complications of disease. Educated patient in depth on reasons to return to the office immediately should he/she discover anything concerning or new on the feet. All questions answered. Discussed proper shoes as well.   Discussed the etiology and treatment options for the condition in detail with the patient. Recommended debridement of the nails today. Sharp and mechanical debridement performed of all painful and mycotic nails today. Nails debrided in length and thickness using a nail nipper to level of comfort.  She would like to return for regular nail care and she will be scheduled  for this in 3 months  Her psoriasis is stable and quiescent she is utilizing the tacrolimus  ointment and refill was recently sent for her.  She is going to discuss with her PCP about seeing a dermatologist for this long-term, there may be other biologic therapy available to her that could help as well.    Return in about 3 months (around 10/06/2024) for at risk diabetic foot care.

## 2024-07-13 ENCOUNTER — Other Ambulatory Visit: Payer: Self-pay | Admitting: Emergency Medicine

## 2024-07-19 ENCOUNTER — Ambulatory Visit: Admitting: Emergency Medicine

## 2024-07-20 ENCOUNTER — Encounter: Payer: Self-pay | Admitting: Emergency Medicine

## 2024-07-20 ENCOUNTER — Ambulatory Visit (INDEPENDENT_AMBULATORY_CARE_PROVIDER_SITE_OTHER): Admitting: Emergency Medicine

## 2024-07-20 ENCOUNTER — Ambulatory Visit: Payer: Self-pay | Admitting: Emergency Medicine

## 2024-07-20 VITALS — BP 124/80 | HR 57 | Temp 97.8°F | Ht 64.0 in | Wt 163.0 lb

## 2024-07-20 DIAGNOSIS — Z13820 Encounter for screening for osteoporosis: Secondary | ICD-10-CM

## 2024-07-20 DIAGNOSIS — E1159 Type 2 diabetes mellitus with other circulatory complications: Secondary | ICD-10-CM

## 2024-07-20 DIAGNOSIS — E118 Type 2 diabetes mellitus with unspecified complications: Secondary | ICD-10-CM | POA: Insufficient documentation

## 2024-07-20 DIAGNOSIS — E785 Hyperlipidemia, unspecified: Secondary | ICD-10-CM

## 2024-07-20 DIAGNOSIS — N1831 Chronic kidney disease, stage 3a: Secondary | ICD-10-CM | POA: Diagnosis not present

## 2024-07-20 DIAGNOSIS — R4189 Other symptoms and signs involving cognitive functions and awareness: Secondary | ICD-10-CM | POA: Diagnosis not present

## 2024-07-20 DIAGNOSIS — E1169 Type 2 diabetes mellitus with other specified complication: Secondary | ICD-10-CM | POA: Diagnosis not present

## 2024-07-20 DIAGNOSIS — K635 Polyp of colon: Secondary | ICD-10-CM

## 2024-07-20 DIAGNOSIS — E1122 Type 2 diabetes mellitus with diabetic chronic kidney disease: Secondary | ICD-10-CM

## 2024-07-20 DIAGNOSIS — Z7984 Long term (current) use of oral hypoglycemic drugs: Secondary | ICD-10-CM

## 2024-07-20 DIAGNOSIS — I152 Hypertension secondary to endocrine disorders: Secondary | ICD-10-CM

## 2024-07-20 DIAGNOSIS — Z1211 Encounter for screening for malignant neoplasm of colon: Secondary | ICD-10-CM

## 2024-07-20 LAB — CBC WITH DIFFERENTIAL/PLATELET
Basophils Absolute: 0.1 K/uL (ref 0.0–0.1)
Basophils Relative: 0.9 % (ref 0.0–3.0)
Eosinophils Absolute: 0.1 K/uL (ref 0.0–0.7)
Eosinophils Relative: 1.4 % (ref 0.0–5.0)
HCT: 39.9 % (ref 36.0–46.0)
Hemoglobin: 13 g/dL (ref 12.0–15.0)
Lymphocytes Relative: 24 % (ref 12.0–46.0)
Lymphs Abs: 1.5 K/uL (ref 0.7–4.0)
MCHC: 32.6 g/dL (ref 30.0–36.0)
MCV: 89.7 fl (ref 78.0–100.0)
Monocytes Absolute: 0.5 K/uL (ref 0.1–1.0)
Monocytes Relative: 8.8 % (ref 3.0–12.0)
Neutro Abs: 4 K/uL (ref 1.4–7.7)
Neutrophils Relative %: 64.9 % (ref 43.0–77.0)
Platelets: 230 K/uL (ref 150.0–400.0)
RBC: 4.45 Mil/uL (ref 3.87–5.11)
RDW: 13.7 % (ref 11.5–15.5)
WBC: 6.1 K/uL (ref 4.0–10.5)

## 2024-07-20 LAB — COMPREHENSIVE METABOLIC PANEL WITH GFR
ALT: 9 U/L (ref 0–35)
AST: 14 U/L (ref 0–37)
Albumin: 3.8 g/dL (ref 3.5–5.2)
Alkaline Phosphatase: 37 U/L — ABNORMAL LOW (ref 39–117)
BUN: 11 mg/dL (ref 6–23)
CO2: 28 meq/L (ref 19–32)
Calcium: 9.1 mg/dL (ref 8.4–10.5)
Chloride: 107 meq/L (ref 96–112)
Creatinine, Ser: 1.17 mg/dL (ref 0.40–1.20)
GFR: 45.74 mL/min — ABNORMAL LOW (ref >60.00)
Glucose, Bld: 96 mg/dL (ref 70–99)
Potassium: 4 meq/L (ref 3.5–5.1)
Sodium: 141 meq/L (ref 135–145)
Total Bilirubin: 0.4 mg/dL (ref 0.2–1.2)
Total Protein: 6.9 g/dL (ref 6.0–8.3)

## 2024-07-20 LAB — LIPID PANEL
Cholesterol: 141 mg/dL (ref 0–200)
HDL: 49.7 mg/dL (ref >39.00)
LDL Cholesterol: 70 mg/dL (ref 0–99)
NonHDL: 91.47
Total CHOL/HDL Ratio: 3
Triglycerides: 107 mg/dL (ref 0.0–149.0)
VLDL: 21.4 mg/dL (ref 0.0–40.0)

## 2024-07-20 LAB — HEMOGLOBIN A1C: Hgb A1c MFr Bld: 6.2 % (ref 4.6–6.5)

## 2024-07-20 MED ORDER — TRAMADOL HCL 50 MG PO TABS: 50.0000 mg | ORAL_TABLET | Freq: Three times a day (TID) | ORAL | 1 refills | Status: DC | PRN

## 2024-07-20 NOTE — Assessment & Plan Note (Signed)
 BP Readings from Last 3 Encounters:  07/20/24 124/80  02/15/24 106/67  01/20/24 136/78   Lab Results  Component Value Date   HGBA1C 5.8 (A) 01/20/2024  Well-controlled hypertension and diabetes. Blood work done today Recommend to continue amlodipine  5 mg daily, Farxiga  10 mg daily, metformin 500 mg twice a day and valsartan  320 mg daily Cardiovascular risks associated with hypertension and diabetes discussed Diet and nutrition discussed

## 2024-07-20 NOTE — Patient Instructions (Signed)
 Health Maintenance After Age 75 After age 27, you are at a higher risk for certain long-term diseases and infections as well as injuries from falls. Falls are a major cause of broken bones and head injuries in people who are older than age 73. Getting regular preventive care can help to keep you healthy and well. Preventive care includes getting regular testing and making lifestyle changes as recommended by your health care provider. Talk with your health care provider about: Which screenings and tests you should have. A screening is a test that checks for a disease when you have no symptoms. A diet and exercise plan that is right for you. What should I know about screenings and tests to prevent falls? Screening and testing are the best ways to find a health problem early. Early diagnosis and treatment give you the best chance of managing medical conditions that are common after age 90. Certain conditions and lifestyle choices may make you more likely to have a fall. Your health care provider may recommend: Regular vision checks. Poor vision and conditions such as cataracts can make you more likely to have a fall. If you wear glasses, make sure to get your prescription updated if your vision changes. Medicine review. Work with your health care provider to regularly review all of the medicines you are taking, including over-the-counter medicines. Ask your health care provider about any side effects that may make you more likely to have a fall. Tell your health care provider if any medicines that you take make you feel dizzy or sleepy. Strength and balance checks. Your health care provider may recommend certain tests to check your strength and balance while standing, walking, or changing positions. Foot health exam. Foot pain and numbness, as well as not wearing proper footwear, can make you more likely to have a fall. Screenings, including: Osteoporosis screening. Osteoporosis is a condition that causes  the bones to get weaker and break more easily. Blood pressure screening. Blood pressure changes and medicines to control blood pressure can make you feel dizzy. Depression screening. You may be more likely to have a fall if you have a fear of falling, feel depressed, or feel unable to do activities that you used to do. Alcohol  use screening. Using too much alcohol  can affect your balance and may make you more likely to have a fall. Follow these instructions at home: Lifestyle Do not drink alcohol  if: Your health care provider tells you not to drink. If you drink alcohol : Limit how much you have to: 0-1 drink a day for women. 0-2 drinks a day for men. Know how much alcohol  is in your drink. In the [place] one drink equals one 12 oz bottle of beer (355 mL), one 5 oz glass of wine (148 mL), or one 1 oz glass of hard liquor (44 mL). Do not use any products that contain nicotine or tobacco. These products include cigarettes, chewing tobacco, and vaping devices, such as e-cigarettes. If you need help quitting, ask your health care provider. Activity  Follow a regular exercise program to stay fit. This will help you maintain your balance. Ask your health care provider what types of exercise are appropriate for you. If you need a cane or walker, use it as recommended by your health care provider. Wear supportive shoes that have nonskid soles. Safety  Remove any tripping hazards, such as rugs, cords, and clutter. Install safety equipment such as grab bars in bathrooms and safety rails on stairs. Keep rooms and walkways  well-lit. General instructions Talk with your health care provider about your risks for falling. Tell your health care provider if: You fall. Be sure to tell your health care provider about all falls, even ones that seem minor. You feel dizzy, tiredness (fatigue), or off-balance. Take over-the-counter and prescription medicines only as told by your health care provider. These include  supplements. Eat a healthy diet and maintain a healthy weight. A healthy diet includes low-fat dairy products, low-fat (lean) meats, and fiber from whole grains, beans, and lots of fruits and vegetables. Stay current with your vaccines. Schedule regular health, dental, and eye exams. Summary Having a healthy lifestyle and getting preventive care can help to protect your health and wellness after age 15. Screening and testing are the best way to find a health problem early and help you avoid having a fall. Early diagnosis and treatment give you the best chance for managing medical conditions that are more common for people who are older than age 42. Falls are a major cause of broken bones and head injuries in people who are older than age 64. Take precautions to prevent a fall at home. Work with your health care provider to learn what changes you can make to improve your health and wellness and to prevent falls. This information is not intended to replace advice given to you by your health care provider. Make sure you discuss any questions you have with your health care provider. Document Revised: 01/06/2021 Document Reviewed: 01/06/2021 Elsevier Patient Education  2024 [employer].

## 2024-07-20 NOTE — Assessment & Plan Note (Signed)
Recommend neurology evaluation Referral placed today

## 2024-07-20 NOTE — Assessment & Plan Note (Signed)
 Recent podiatrist evaluation as follows: Assessment:    1. Pain due to onychomycosis of toenails of both feet   2. 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   Patient educated on diabetes. Discussed proper diabetic foot care and discussed risks and complications of disease. Educated patient in depth on reasons to return to the office immediately should he/she discover anything concerning or new on the feet. All questions answered. Discussed proper shoes as well.    Discussed the etiology and treatment options for the condition in detail with the patient. Recommended debridement of the nails today. Sharp and mechanical debridement performed of all painful and mycotic nails today. Nails debrided in length and thickness using a nail nipper to level of comfort.  She would like to return for regular nail care and she will be scheduled for this in 3 months   Her psoriasis is stable and quiescent she is utilizing the tacrolimus  ointment and refill was recently sent for her.  She is going to discuss with her PCP about seeing a dermatologist for this long-term, there may be other biologic therapy available to her that could help as well.

## 2024-07-20 NOTE — Assessment & Plan Note (Signed)
 Chronic stable condition Advised to stay well-hydrated and avoid NSAIDs Recommend to continue Farxiga  10 mg daily

## 2024-07-20 NOTE — Progress Notes (Signed)
 Alejandra Duffy 73 y.o.   Chief Complaint  Patient presents with   Follow-up    Pt need tramdol refilled, pt daughter stated that she want to see about getting her something for early dementia     HISTORY OF PRESENT ILLNESS: This is a 73 y.o. female here for follow-up of chronic medical conditions Overall doing well. Daughter concerned about cognitive decline  HPI   Prior to Admission medications   Medication Sig Start Date End Date Taking? Authorizing Provider  albuterol  (VENTOLIN  HFA) 108 (90 Base) MCG/ACT inhaler TAKE 2 PUFFS BY MOUTH EVERY 6 HOURS AS NEEDED FOR WHEEZE OR SHORTNESS OF BREATH 04/03/24  Yes Henya Aguallo, Emil Schanz, MD  amLODipine  (NORVASC ) 5 MG tablet Take 1 tablet (5 mg total) by mouth daily. 07/06/23  Yes Wadsworth Skolnick, Emil Schanz, MD  Blood Glucose Monitoring Suppl (TRUE METRIX AIR GLUCOSE METER) w/Device KIT  12/22/21  Yes [provider]  citalopram  (CELEXA ) 40 MG tablet Take 40 mg by mouth daily.   Yes [provider]  clobetasol  ointment (TEMOVATE ) 0.05 % Apply topically 2 (two) times daily. 11/02/23  Yes McDonald, Juliene SAUNDERS, DPM  dicyclomine  (BENTYL ) 20 MG tablet TAKE 1 TABLET TWICE DAILY 06/12/24  Yes Tiasha Helvie Jose, MD  diphenhydrAMINE (BENADRYL) 25 MG tablet Take 50 mg by mouth daily as needed for allergies.   Yes [provider]  diphenoxylate-atropine (LOMOTIL) 2.5-0.025 MG tablet Take by mouth 4 (four) times daily as needed for diarrhea or loose stools.   Yes [provider]  FARXIGA  10 MG TABS tablet Take 1 tablet (10 mg total) by mouth daily before breakfast. 01/20/24  Yes Mialani Reicks, Emil Schanz, MD  fenofibrate  160 MG tablet Take 1 tablet (160 mg total) by mouth daily. 01/17/24  Yes Emylie Amster, Emil Schanz, MD  levothyroxine  (SYNTHROID ) 50 MCG tablet TAKE 1 TABLET BY MOUTH EVERY DAY BEFORE BREAKFAST 07/13/24  Yes Lisvet Rasheed, Emil Schanz, MD  loperamide  (IMODIUM ) 2 MG capsule Take 1 capsule (2 mg total) by mouth 4 (four)  times daily as needed for diarrhea or loose stools. 11/14/17  Yes Gretta Ozell CROME, PA-C  losartan (COZAAR) 50 MG tablet Take 50 mg by mouth daily.   Yes [provider]  metFORMIN (GLUCOPHAGE) 500 MG tablet Take 500 mg by mouth 2 (two) times daily with a meal.   Yes [provider]  naloxone (NARCAN) nasal spray 4 mg/0.1 mL    Yes [provider]  omeprazole  (PRILOSEC) 20 MG capsule TAKE 1 CAPSULE BY MOUTH EVERY DAY 06/28/24  Yes Burgandy Hackworth, Emil Schanz, MD  potassium chloride  (KLOR-CON  M) 10 MEQ tablet TAKE 1 TABLET TWICE DAILY 03/23/24  Yes Frenchie Dangerfield Jose, MD  potassium chloride  (KLOR-CON ) 10 MEQ tablet Take by mouth. 12/10/21  Yes [provider]  pregabalin  (LYRICA ) 75 MG capsule Take 1 capsule (75 mg total) by mouth 2 (two) times daily. 01/17/24  Yes Correne Lalani Jose, MD  simvastatin  (ZOCOR ) 40 MG tablet TAKE 1 TABLET EVERY DAY 06/12/24  Yes Savonna Birchmeier, Emil Schanz, MD  sitaGLIPtin  (JANUVIA ) 100 MG tablet Take 1 tablet (100 mg total) by mouth daily. 07/11/23  Yes Jayvier Burgher, Emil Schanz, MD  tacrolimus  (PROTOPIC ) 0.1 % ointment APPLY TO AFFECTED AREA TOPICALLY EVERY DAY 06/29/24  Yes McDonald, Juliene SAUNDERS, DPM  traZODone  (DESYREL ) 50 MG tablet TAKE 100 MG BY MOUTH AT BEDTIME. 03/04/24  Yes Kale Rondeau, Emil Schanz, MD  TRUE METRIX BLOOD GLUCOSE TEST test strip  12/19/21  Yes [provider]  valsartan  (DIOVAN ) 320 MG  tablet Take 320 mg by mouth daily.   Yes [provider]  ciclopirox (LOPROX) 0.77 % cream SMARTSIG:1 Sparingly Topical Twice Daily PRN Patient not taking: Reported on 07/20/2024 06/10/23   [provider]  colchicine 0.6 MG tablet Take 0.6 mg by mouth 2 (two) times daily as needed (gout). Patient not taking: Reported on 07/20/2024    [provider]  diphenhydrAMINE (BENADRYL) 25 MG tablet Take 25 mg by mouth every 6 (six) hours as needed for allergies. Patient not taking: Reported on 07/20/2024    [provider]  gabapentin (NEURONTIN) 300 MG capsule Take 300 mg by mouth 2 (two) times daily. Patient not taking: Reported on 07/20/2024    [provider]  levofloxacin (LEVAQUIN) 500 MG tablet levofloxacin 500 mg tablet  TAKE 1 TABLET BY MOUTH EVERY 24 HOURS FOR 7 DAYS Patient not taking: Reported on 07/20/2024    [provider]  methocarbamol (ROBAXIN) 750 MG tablet Take 750 mg by mouth 4 (four) times daily. Patient not taking: Reported on 07/20/2024    [provider]  methylPREDNISolone  (MEDROL  DOSEPAK) 4 MG TBPK tablet 6 day dose pack - take as directed Patient not taking: Reported on 07/20/2024 02/16/22   Silva Juliene SAUNDERS, DPM  nebivolol  (BYSTOLIC ) 10 MG tablet Take 1 tablet (10 mg total) by mouth daily. Patient not taking: Reported on 07/20/2024 01/17/24   Purcell Emil Schanz, MD  rosuvastatin (CRESTOR) 10 MG tablet     [provider]    Allergies  Allergen Reactions   Ibuprofen Other (See Comments)    Gasteric issues   Tetracyclines & Related Other (See Comments)    Builds up fluid in the brain   Tetracyclines & Related Other (See Comments)    Demeclocycline-Fluid on the brain    Patient Active Problem List   Diagnosis Date Noted   Yeast dermatitis 01/20/2024   Stage 3a chronic kidney disease (HCC) 01/20/2024   Dyslipidemia associated with type 2 diabetes mellitus (HCC) 07/06/2023   Hypothyroidism    Hypertension associated with diabetes (HCC) 04/04/2012    Past Medical History:  Diagnosis Date   Arthritis    Asthma    Dementia (HCC)    Depression    Diabetes mellitus    type II   GERD (gastroesophageal reflux disease)    Gout    Hyperlipemia    Hyperparathyroidism    Hypertension    Hypothyroidism    IBS (irritable bowel syndrome)    diarrhea   Thyroid disease     Past Surgical History:  Procedure Laterality Date   CHOLECYSTECTOMY     COLONOSCOPY  2013   DR Medoff   LEFT HEART CATH AND CORONARY ANGIOGRAPHY N/A  04/20/2017   Procedure: LEFT HEART CATH AND CORONARY ANGIOGRAPHY;  Surgeon: Levern Hutching, MD;  Location: [facility];  Service: Cardiovascular;  Laterality: N/A;   TOOTH EXTRACTION Bilateral 05/10/2018   Procedure: DENTAL RESTORATION/EXTRACTIONS;  Surgeon: Sheryle Hamilton, DDS;  Location: [facility] OR;  Service: Oral Surgery;  Laterality: Bilateral;   VESICOVAGINAL FISTULA CLOSURE W/ TAH      Social History   Socioeconomic History   Marital status: Divorced    Spouse name: Not on file   Number of children: 2   Years of education: Not on file   Highest education level: Not on file  Occupational History   Occupation: Disabled    Employer: UNEMPLOYED  Tobacco Use   Smoking status: Former    Current packs/day: 0.00  Average packs/day: 0.3 packs/day for 15.0 years (4.5 ttl pk-yrs)    Types: Cigarettes    Start date: 03/31/1996    Quit date: 04/01/2011    Years since quitting: 13.3   Smokeless tobacco: Never  Vaping Use   Vaping status: Never Used  Substance and Sexual Activity   Alcohol use: No   Drug use: No   Sexual activity: Not Currently  Other Topics Concern   Not on file  Social History Narrative   Not on file   Social Drivers of Health   Financial Resource Strain: Low Risk  (04/10/2024)   Overall Financial Resource Strain (CARDIA)    Difficulty of Paying Living Expenses: Not hard at all  Food Insecurity: No Food Insecurity (04/10/2024)   Hunger Vital Sign    Worried About Running Out of Food in the Last Year: Never true    Ran Out of Food in the Last Year: Never true  Transportation Needs: No Transportation Needs (04/10/2024)   PRAPARE - Administrator, Civil Service (Medical): No    Lack of Transportation (Non-Medical): No  Physical Activity: Insufficiently Active (04/10/2024)   Exercise Vital Sign    Days of Exercise per Week: 3 days    Minutes of Exercise per Session: 30 min  Stress: Stress Concern Present (04/10/2024)   [employer] of Occupational  Health - Occupational Stress Questionnaire    Feeling of Stress: To some extent  Social Connections: Moderately Integrated (04/10/2024)   Social Connection and Isolation Panel    Frequency of Communication with Friends and Family: More than three times a week    Frequency of Social Gatherings with Friends and Family: More than three times a week    Attends Religious Services: More than 4 times per year    Active Member of [employer] or Organizations: Yes    Attends Banker Meetings: Never    Marital Status: Divorced  Catering Manager Violence: Not At Risk (04/10/2024)   Humiliation, Afraid, Rape, and Kick questionnaire    Fear of Current or Ex-Partner: No    Emotionally Abused: No    Physically Abused: No    Sexually Abused: No    Family History  Problem Relation Age of Onset   Diabetes Mother    Asthma Father    Allergies Father    Allergies Brother    Hyperlipidemia Other    Breast cancer Neg Hx    Colon cancer Neg Hx    Stomach cancer Neg Hx      Review of Systems  Constitutional: Negative.  Negative for chills and fever.  HENT: Negative.  Negative for congestion and sore throat.   Respiratory: Negative.  Negative for cough and shortness of breath.   Cardiovascular: Negative.  Negative for chest pain and palpitations.  Gastrointestinal:  Negative for abdominal pain, diarrhea, nausea and vomiting.  Genitourinary: Negative.  Negative for dysuria and hematuria.  Skin: Negative.  Negative for rash.  Neurological: Negative.  Negative for dizziness and headaches.  All other systems reviewed and are negative.   Vitals:   07/20/24 1015  BP: 124/80  Pulse: (!) 57  Temp: 97.8 F (36.6 C)  SpO2: 97%    Physical Exam Vitals reviewed.  Constitutional:      Appearance: Normal appearance.  HENT:     Head: Normocephalic.     Mouth/Throat:     Mouth: Mucous membranes are moist.     Pharynx: Oropharynx is clear.  Eyes:     Extraocular  Movements: Extraocular  movements intact.     Pupils: Pupils are equal, round, and reactive to light.  Cardiovascular:     Rate and Rhythm: Normal rate and regular rhythm.     Pulses: Normal pulses.     Heart sounds: Normal heart sounds.  Pulmonary:     Effort: Pulmonary effort is normal.     Breath sounds: Normal breath sounds.  Abdominal:     Palpations: Abdomen is soft.     Tenderness: There is no abdominal tenderness.  Musculoskeletal:     Cervical back: No tenderness.     Right lower leg: No edema.     Left lower leg: No edema.  Lymphadenopathy:     Cervical: No cervical adenopathy.  Skin:    General: Skin is warm and dry.  Neurological:     General: No focal deficit present.     Mental Status: She is alert and oriented to person, place, and time.  Psychiatric:        Mood and Affect: Mood normal.        Behavior: Behavior normal.      ASSESSMENT & PLAN: A total of 40 minutes was spent with the patient and counseling/coordination of care regarding preparing for this visit, review of most recent office visit notes, review of multiple chronic medical conditions and their management, cardiovascular risks associated with hypertension and diabetes, review of all medications, review of most recent bloodwork results, review of health maintenance items, education on nutrition, prognosis, documentation, and need for follow up.   Problem List Items Addressed This Visit       Cardiovascular and Mediastinum   Hypertension associated with diabetes (HCC) - Primary   BP Readings from Last 3 Encounters:  07/20/24 124/80  02/15/24 106/67  01/20/24 136/78   Lab Results  Component Value Date   HGBA1C 5.8 (A) 01/20/2024  Well-controlled hypertension and diabetes. Blood work done today Recommend to continue amlodipine  5 mg daily, Farxiga  10 mg daily, metformin 500 mg twice a day and valsartan  320 mg daily Cardiovascular risks associated with hypertension and diabetes discussed Diet and nutrition  discussed       Relevant Orders   Comprehensive metabolic panel with GFR   Hemoglobin A1c   CBC with Differential/Platelet   Lipid panel     Endocrine   Dyslipidemia associated with type 2 diabetes mellitus (HCC)   Diet and nutrition discussed Lipid profile done today Continue metformin and Farxiga  Continue simvastatin  40 mg daily      Relevant Orders   Comprehensive metabolic panel with GFR   Hemoglobin A1c   CBC with Differential/Platelet   Lipid panel   Diabetic feet [facility])   Recent podiatrist evaluation as follows: Assessment:    1. Pain due to onychomycosis of toenails of both feet   2. 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   Patient educated on diabetes. Discussed proper diabetic foot care and discussed risks and complications of disease. Educated patient in depth on reasons to return to the office immediately should he/she discover anything concerning or new on the feet. All questions answered. Discussed proper shoes as well.    Discussed the etiology and treatment options for the condition in detail with the patient. Recommended debridement of the nails today. Sharp and mechanical debridement performed of all painful and mycotic nails today. Nails  debrided in length and thickness using a nail nipper to level of comfort.  She would like to return for regular nail care and she will be scheduled for this in 3 months   Her psoriasis is stable and quiescent she is utilizing the tacrolimus  ointment and refill was recently sent for her.  She is going to discuss with her PCP about seeing a dermatologist for this long-term, there may be other biologic therapy available to her that could help as well.        Relevant Medications   traMADol  (ULTRAM ) 50 MG tablet     Genitourinary   Stage 3a chronic kidney disease (HCC)   Chronic stable  condition Advised to stay well-hydrated and avoid NSAIDs Recommend to continue Farxiga  10 mg daily        Other   Cognitive decline   Recommend neurology evaluation Referral placed today      Relevant Orders   Ambulatory referral to Neurology   Other Visit Diagnoses       Polyp of colon, unspecified part of colon, unspecified type         Osteoporosis screening       Relevant Orders   HM DEXA SCAN     Screening for colon cancer       Relevant Orders   Ambulatory referral to Gastroenterology      Patient Instructions  Health Maintenance After Age 28 After age 49, you are at a higher risk for certain long-term diseases and infections as well as injuries from falls. Falls are a major cause of broken bones and head injuries in people who are older than age 58. Getting regular preventive care can help to keep you healthy and well. Preventive care includes getting regular testing and making lifestyle changes as recommended by your health care provider. Talk with your health care provider about: Which screenings and tests you should have. A screening is a test that checks for a disease when you have no symptoms. A diet and exercise plan that is right for you. What should I know about screenings and tests to prevent falls? Screening and testing are the best ways to find a health problem early. Early diagnosis and treatment give you the best chance of managing medical conditions that are common after age 71. Certain conditions and lifestyle choices may make you more likely to have a fall. Your health care provider may recommend: Regular vision checks. Poor vision and conditions such as cataracts can make you more likely to have a fall. If you wear glasses, make sure to get your prescription updated if your vision changes. Medicine review. Work with your health care provider to regularly review all of the medicines you are taking, including over-the-counter medicines. Ask your health care  provider about any side effects that may make you more likely to have a fall. Tell your health care provider if any medicines that you take make you feel dizzy or sleepy. Strength and balance checks. Your health care provider may recommend certain tests to check your strength and balance while standing, walking, or changing positions. Foot health exam. Foot pain and numbness, as well as not wearing proper footwear, can make you more likely to have a fall. Screenings, including: Osteoporosis screening. Osteoporosis is a condition that causes the bones to get weaker and break more easily. Blood pressure screening. Blood pressure changes and medicines to control blood pressure can make you feel dizzy. Depression screening. You may be more likely to have a  fall if you have a fear of falling, feel depressed, or feel unable to do activities that you used to do. Alcohol use screening. Using too much alcohol can affect your balance and may make you more likely to have a fall. Follow these instructions at home: Lifestyle Do not drink alcohol if: Your health care provider tells you not to drink. If you drink alcohol: Limit how much you have to: 0-1 drink a day for women. 0-2 drinks a day for men. Know how much alcohol is in your drink. In the [place] one drink equals one 12 oz bottle of beer (355 mL), one 5 oz glass of wine (148 mL), or one 1 oz glass of hard liquor (44 mL). Do not use any products that contain nicotine or tobacco. These products include cigarettes, chewing tobacco, and vaping devices, such as e-cigarettes. If you need help quitting, ask your health care provider. Activity  Follow a regular exercise program to stay fit. This will help you maintain your balance. Ask your health care provider what types of exercise are appropriate for you. If you need a cane or walker, use it as recommended by your health care provider. Wear supportive shoes that have nonskid soles. Safety  Remove any  tripping hazards, such as rugs, cords, and clutter. Install safety equipment such as grab bars in bathrooms and safety rails on stairs. Keep rooms and walkways well-lit. General instructions Talk with your health care provider about your risks for falling. Tell your health care provider if: You fall. Be sure to tell your health care provider about all falls, even ones that seem minor. You feel dizzy, tiredness (fatigue), or off-balance. Take over-the-counter and prescription medicines only as told by your health care provider. These include supplements. Eat a healthy diet and maintain a healthy weight. A healthy diet includes low-fat dairy products, low-fat (lean) meats, and fiber from whole grains, beans, and lots of fruits and vegetables. Stay current with your vaccines. Schedule regular health, dental, and eye exams. Summary Having a healthy lifestyle and getting preventive care can help to protect your health and wellness after age 69. Screening and testing are the best way to find a health problem early and help you avoid having a fall. Early diagnosis and treatment give you the best chance for managing medical conditions that are more common for people who are older than age 55. Falls are a major cause of broken bones and head injuries in people who are older than age 69. Take precautions to prevent a fall at home. Work with your health care provider to learn what changes you can make to improve your health and wellness and to prevent falls. This information is not intended to replace advice given to you by your health care provider. Make sure you discuss any questions you have with your health care provider. Document Revised: 01/06/2021 Document Reviewed: 01/06/2021 Elsevier Patient Education  2024 Elsevier Inc.    Emil Schaumann, MD [facility]

## 2024-07-20 NOTE — Assessment & Plan Note (Signed)
 Diet and nutrition discussed Lipid profile done today Continue metformin and Farxiga  Continue simvastatin  40 mg daily

## 2024-07-21 ENCOUNTER — Other Ambulatory Visit: Payer: Self-pay | Admitting: Emergency Medicine

## 2024-07-21 NOTE — Telephone Encounter (Unsigned)
 Copied from CRM [id number]. Topic: Clinical - Medication Refill >> Jul 21, 2024 11:43 AM Vena HERO wrote: Medication: citalopram  (CELEXA ) 40 MG tablet  Has the patient contacted their pharmacy? Yes (Agent: If no, request that the patient contact the pharmacy for the refill. If patient does not wish to contact the pharmacy document the reason why and proceed with request.) (Agent: If yes, when and what did the pharmacy advise?) Call provider  This is the patient's preferred pharmacy:  [id number] - [place], [place] - [address] AT [facility] [address] [place] [address] Phone: [phone] Fax: [phone]   Is this the correct pharmacy for this prescription? Yes If no, delete pharmacy and type the correct one.   Has the prescription been filled recently? No  Is the patient out of the medication? Yes  Has the patient been seen for an appointment in the last year OR does the patient have an upcoming appointment? Yes  Can we respond through MyChart? No, phone call preferred  Agent: Please be advised that Rx refills may take up to 3 business days. We ask that you follow-up with your pharmacy.

## 2024-07-24 MED ORDER — CITALOPRAM HYDROBROMIDE 40 MG PO TABS: 40.0000 mg | ORAL_TABLET | Freq: Every day | ORAL | 0 refills | Status: AC

## 2024-07-27 ENCOUNTER — Other Ambulatory Visit: Payer: Self-pay | Admitting: Emergency Medicine

## 2024-07-27 DIAGNOSIS — E1159 Type 2 diabetes mellitus with other circulatory complications: Secondary | ICD-10-CM

## 2024-07-30 DIAGNOSIS — K589 Irritable bowel syndrome without diarrhea: Secondary | ICD-10-CM | POA: Diagnosis not present

## 2024-07-30 DIAGNOSIS — J4489 Other specified chronic obstructive pulmonary disease: Secondary | ICD-10-CM | POA: Diagnosis not present

## 2024-07-30 DIAGNOSIS — E1122 Type 2 diabetes mellitus with diabetic chronic kidney disease: Secondary | ICD-10-CM | POA: Diagnosis not present

## 2024-07-30 DIAGNOSIS — F325 Major depressive disorder, single episode, in full remission: Secondary | ICD-10-CM | POA: Diagnosis not present

## 2024-07-30 DIAGNOSIS — E1142 Type 2 diabetes mellitus with diabetic polyneuropathy: Secondary | ICD-10-CM | POA: Diagnosis not present

## 2024-07-30 DIAGNOSIS — Z5982 Transportation insecurity: Secondary | ICD-10-CM | POA: Diagnosis not present

## 2024-07-30 DIAGNOSIS — K219 Gastro-esophageal reflux disease without esophagitis: Secondary | ICD-10-CM | POA: Diagnosis not present

## 2024-07-30 DIAGNOSIS — Z59861 Financial insecurity, difficulty paying for utilities: Secondary | ICD-10-CM | POA: Diagnosis not present

## 2024-07-30 DIAGNOSIS — H409 Unspecified glaucoma: Secondary | ICD-10-CM | POA: Diagnosis not present

## 2024-07-30 DIAGNOSIS — F17210 Nicotine dependence, cigarettes, uncomplicated: Secondary | ICD-10-CM | POA: Diagnosis not present

## 2024-07-30 DIAGNOSIS — E1136 Type 2 diabetes mellitus with diabetic cataract: Secondary | ICD-10-CM | POA: Diagnosis not present

## 2024-07-30 DIAGNOSIS — G4700 Insomnia, unspecified: Secondary | ICD-10-CM | POA: Diagnosis not present

## 2024-07-30 DIAGNOSIS — L409 Psoriasis, unspecified: Secondary | ICD-10-CM | POA: Diagnosis not present

## 2024-07-30 DIAGNOSIS — I129 Hypertensive chronic kidney disease with stage 1 through stage 4 chronic kidney disease, or unspecified chronic kidney disease: Secondary | ICD-10-CM | POA: Diagnosis not present

## 2024-07-30 DIAGNOSIS — R32 Unspecified urinary incontinence: Secondary | ICD-10-CM | POA: Diagnosis not present

## 2024-07-30 DIAGNOSIS — E785 Hyperlipidemia, unspecified: Secondary | ICD-10-CM | POA: Diagnosis not present

## 2024-07-30 DIAGNOSIS — Z87892 Personal history of anaphylaxis: Secondary | ICD-10-CM | POA: Diagnosis not present

## 2024-07-30 DIAGNOSIS — E876 Hypokalemia: Secondary | ICD-10-CM | POA: Diagnosis not present

## 2024-07-30 DIAGNOSIS — N1831 Chronic kidney disease, stage 3a: Secondary | ICD-10-CM | POA: Diagnosis not present

## 2024-07-30 DIAGNOSIS — Z5941 Food insecurity: Secondary | ICD-10-CM | POA: Diagnosis not present

## 2024-07-30 DIAGNOSIS — E039 Hypothyroidism, unspecified: Secondary | ICD-10-CM | POA: Diagnosis not present

## 2024-07-31 ENCOUNTER — Encounter: Payer: Self-pay | Admitting: Internal Medicine

## 2024-08-01 ENCOUNTER — Other Ambulatory Visit: Payer: Self-pay | Admitting: Emergency Medicine

## 2024-08-18 ENCOUNTER — Encounter: Payer: Self-pay | Admitting: Gastroenterology

## 2024-08-22 ENCOUNTER — Other Ambulatory Visit: Payer: Self-pay | Admitting: Emergency Medicine

## 2024-08-22 DIAGNOSIS — E118 Type 2 diabetes mellitus with unspecified complications: Secondary | ICD-10-CM

## 2024-08-28 ENCOUNTER — Other Ambulatory Visit: Payer: Self-pay | Admitting: Emergency Medicine

## 2024-08-28 DIAGNOSIS — Z1231 Encounter for screening mammogram for malignant neoplasm of breast: Secondary | ICD-10-CM

## 2024-09-14 ENCOUNTER — Telehealth: Payer: Self-pay

## 2024-09-14 NOTE — Telephone Encounter (Signed)
 Copied from CRM #[id number]. Topic: Clinical - Prescription Issue >> Sep 14, 2024  3:40 PM Berneda F wrote: Reason for CRM: Patient states she went to fill her prescription for JANUVIA  100 MG tablet and they are charging her a lot of money when she never had to pay before. Is there something else we can call in for her please?  [id number] - [place], [place] - [address] AT [facility] [address] [place] [address] Phone: [phone] Fax: [phone] Hours: Open 24 hours

## 2024-09-15 NOTE — Telephone Encounter (Signed)
Please advise if there is another alternative.

## 2024-09-19 ENCOUNTER — Ambulatory Visit
Admission: RE | Admit: 2024-09-19 | Discharge: 2024-09-19 | Disposition: A | Source: Ambulatory Visit | Attending: Emergency Medicine

## 2024-09-19 DIAGNOSIS — Z1231 Encounter for screening mammogram for malignant neoplasm of breast: Secondary | ICD-10-CM

## 2024-09-20 NOTE — Telephone Encounter (Signed)
 Called and spoke with patient. Informed her of response from pharmacy and advised her to reach out to her insurance company to see what alternatives are covered. She stated she will reach out to the insurance company today

## 2024-09-20 NOTE — Telephone Encounter (Signed)
 Patient needs to find out from her insurance directly.

## 2024-09-21 ENCOUNTER — Telehealth: Payer: Self-pay

## 2024-09-21 NOTE — Telephone Encounter (Signed)
 Her charts says she quit in 2012

## 2024-09-21 NOTE — Telephone Encounter (Signed)
 Copied from CRM #[id number]. Topic: General - Call Back - No Documentation >> Sep 21, 2024 10:24 AM Olam RAMAN wrote: Reason for CRM: [id number] Valery calling from pt insurance agent. Calling to advise for provider to change that pt is not a smoker and pt stated she has not been a smoker since she was in her 4's and this needs ot be changed per her insurance And wants to be sure if she has copd or chronic broncitis

## 2024-09-21 NOTE — Telephone Encounter (Signed)
 Please advise.

## 2024-09-22 NOTE — Telephone Encounter (Signed)
 Called patient to verify her smoke history to update in her chart and inform her as to what Dr Purcell stated

## 2024-09-22 NOTE — Telephone Encounter (Signed)
 Chronic bronchitis is a type of COPD just like emphysema is.  Regarding her smoking status, please go in her record and correct present status.

## 2024-09-28 NOTE — Progress Notes (Signed)
 "     [facility] Consult Note:  History: Alejandra Duffy 09/29/2024  Referring provider: Purcell Emil Schanz, MD  Reason for consult/chief complaint: Colonoscopy (HAD LAST COLON 4YRS AGO, hx of colon polyps ) and Abdominal Pain (Pt states her stomach has been hurting )   Subjective  Prior history:  July 2020 office consult with Dr. Legrand for another opinion on many years of abdominal pain with diarrhea and heartburn, previous patient of Dr. Luis, carried diagnosis of IBS.  Clinical details in my extensive note.  Last colonoscopy with Medoff in early 2019   further testing planned including fecal elastase and CT scan chest abdomen and pelvis (because weight loss was also reported).  CT scan was done, fecal elastase was not Clinician had concerns regarding potential use of bile acid binding agent due to patient's polypharmacy   Discussed the use of AI scribe software for clinical note transcription with the patient, who gave verbal consent to proceed.  History of Present Illness [name] is a 74 year old female with prior cholecystectomy and colonic polyps who presents for evaluation of chronic abdominal pain and diarrhea.  Abdominal Pain and Diarrhea: - Chronic upper abdominal pain and diarrhea have persisted for several years - Diarrhea has been ongoing since around the time of cholecystectomy - Imodium  provides partial relief of diarrhea but typically requires multiple doses for effect  Medication Administration: - Most medications are taken in the mid-morning, around 10 AM, to avoid sensation that she comes out too soon if taken earlier - Trazodone  is taken at bedtime  Colonic Polyps: - History of colonic polyps based on her recollection of prior records     She still experiences daily abdominal pain that is fairly generalized but sometimes more focused around the xiphoid process.  It might be worse with certain movements.  Does not  seem to change with meals or bowel movements.  Enza still has loose stool every day, typically several BMs that are semiformed to loose and nonbloody.  She will take Imodium  with variable relief.   ROS:  Review of Systems  Constitutional:  Negative for appetite change and unexpected weight change.  HENT:  Negative for mouth sores and voice change.   Eyes:  Negative for pain and redness.  Respiratory:  Negative for cough and shortness of breath.   Cardiovascular:  Negative for chest pain and palpitations.  Genitourinary:  Negative for dysuria and hematuria.  Musculoskeletal:  Positive for arthralgias. Negative for myalgias.  Skin:  Negative for pallor and rash.  Neurological:  Negative for weakness and headaches.  Hematological:  Negative for adenopathy.     Past Medical History: Past Medical History:  Diagnosis Date   Arthritis    Asthma    Dementia (HCC)    Depression    Diabetes mellitus    type II   GERD (gastroesophageal reflux disease)    Gout    Hyperlipemia    Hyperparathyroidism    Hypertension    Hypothyroidism    IBS (irritable bowel syndrome)    diarrhea   Thyroid disease      Past Surgical History: Past Surgical History:  Procedure Laterality Date   CHOLECYSTECTOMY     COLONOSCOPY  2013   DR Medoff   LEFT HEART CATH AND CORONARY ANGIOGRAPHY N/A 04/20/2017   Procedure: LEFT HEART CATH AND CORONARY ANGIOGRAPHY;  Surgeon: Levern Hutching, MD;  Location: [facility];  Service: Cardiovascular;  Laterality: N/A;   TOOTH EXTRACTION Bilateral  05/10/2018   Procedure: DENTAL RESTORATION/EXTRACTIONS;  Surgeon: Sheryle Hamilton, DDS;  Location: [facility];  Service: Oral Surgery;  Laterality: Bilateral;   VESICOVAGINAL FISTULA CLOSURE W/ TAH       Family History: Family History  Problem Relation Age of Onset   Diabetes Mother    Asthma Father    Allergies Father    Allergies Brother    Hyperlipidemia Other    Breast cancer Neg Hx    Colon cancer Neg Hx     Stomach cancer Neg Hx     Social History: Social History   Socioeconomic History   Marital status: Divorced    Spouse name: Not on file   Number of children: 2   Years of education: Not on file   Highest education level: Not on file  Occupational History   Occupation: Disabled    Employer: UNEMPLOYED  Tobacco Use   Smoking status: Former    Types: Cigarettes    Start date: 1980    Quit date: 03/31/1969    Years since quitting: 55.5   Smokeless tobacco: Never  Vaping Use   Vaping status: Never Used  Substance and Sexual Activity   Alcohol use: No   Drug use: No   Sexual activity: Not Currently  Other Topics Concern   Not on file  Social History Narrative   Not on file   Social Drivers of Health   Tobacco Use: Medium Risk (07/20/2024)   Patient History    Smoking Tobacco Use: Former    Smokeless Tobacco Use: Never    Passive Exposure: Not on Actuary Strain: Low Risk (04/10/2024)   Overall Financial Resource Strain (CARDIA)    Difficulty of Paying Living Expenses: Not hard at all  Food Insecurity: No Food Insecurity (04/10/2024)   Epic    Worried About Programme Researcher, Broadcasting/film/video in the Last Year: Never true    Ran Out of Food in the Last Year: Never true  Transportation Needs: No Transportation Needs (04/10/2024)   Epic    Lack of Transportation (Medical): No    Lack of Transportation (Non-Medical): No  Physical Activity: Insufficiently Active (04/10/2024)   Exercise Vital Sign    Days of Exercise per Week: 3 days    Minutes of Exercise per Session: 30 min  Stress: Stress Concern Present (04/10/2024)   [employer] of Occupational Health - Occupational Stress Questionnaire    Feeling of Stress: To some extent  Social Connections: Moderately Integrated (04/10/2024)   Social Connection and Isolation Panel    Frequency of Communication with Friends and Family: More than three times a week    Frequency of Social Gatherings with Friends and Family: More  than three times a week    Attends Religious Services: More than 4 times per year    Active Member of [employer] or Organizations: Yes    Attends Banker Meetings: Never    Marital Status: Divorced  Depression (PHQ2-9): Low Risk (04/10/2024)   Depression (PHQ2-9)    PHQ-2 Score: 0  Alcohol Screen: Low Risk (04/10/2024)   Alcohol Screen    Last Alcohol Screening Score (AUDIT): 0  Housing: Unknown (04/10/2024)   Epic    Unable to Pay for Housing in the Last Year: No    Number of Times Moved in the Last Year: Not on file    Homeless in the Last Year: No  Utilities: Not At Risk (04/10/2024)   Epic    Threatened with loss of utilities:  No  Health Literacy: Adequate Health Literacy (04/10/2024)   B1300 Health Literacy    Frequency of need for help with medical instructions: Never    Allergies: Allergies[1]  Outpatient Meds: Current Outpatient Medications  Medication Sig Dispense Refill   albuterol  (VENTOLIN  HFA) 108 (90 Base) MCG/ACT inhaler TAKE 2 PUFFS BY MOUTH EVERY 6 HOURS AS NEEDED FOR WHEEZE OR SHORTNESS OF BREATH 6.7 each 1   amLODipine  (NORVASC ) 5 MG tablet TAKE 1 TABLET (5 MG TOTAL) BY MOUTH DAILY. 90 tablet 3   Blood Glucose Monitoring Suppl (TRUE METRIX AIR GLUCOSE METER) w/Device KIT      cholestyramine  (QUESTRAN ) 4 g packet Take 1 packet (4 g total) by mouth in the morning. 30 packet 1   citalopram  (CELEXA ) 40 MG tablet Take 1 tablet (40 mg total) by mouth daily. 90 tablet 0   clobetasol  ointment (TEMOVATE ) 0.05 % Apply topically 2 (two) times daily. 60 g 1   dicyclomine  (BENTYL ) 20 MG tablet TAKE 1 TABLET TWICE DAILY 180 tablet 3   diphenhydrAMINE (BENADRYL) 25 MG tablet Take 50 mg by mouth daily as needed for allergies.     diphenoxylate-atropine (LOMOTIL) 2.5-0.025 MG tablet Take by mouth 4 (four) times daily as needed for diarrhea or loose stools.     FARXIGA  10 MG TABS tablet Take 1 tablet (10 mg total) by mouth daily before breakfast. 90 tablet 3   fenofibrate   160 MG tablet Take 1 tablet (160 mg total) by mouth daily. 90 tablet 1   JANUVIA  100 MG tablet TAKE 1 TABLET BY MOUTH EVERY DAY 30 tablet 11   latanoprost (XALATAN) 0.005 % ophthalmic solution 1 drop at bedtime.     levothyroxine  (SYNTHROID ) 50 MCG tablet TAKE 1 TABLET BY MOUTH EVERY DAY BEFORE BREAKFAST 90 tablet 1   loperamide  (IMODIUM ) 2 MG capsule Take 1 capsule (2 mg total) by mouth 4 (four) times daily as needed for diarrhea or loose stools. 12 capsule 0   metFORMIN (GLUCOPHAGE) 500 MG tablet Take 500 mg by mouth 2 (two) times daily with a meal.     Na Sulfate-K Sulfate-Mg Sulfate concentrate (SUPREP) 17.5-3.13-1.6 GM/177ML SOLN Take 1 kit (354 mLs total) by mouth once for 1 dose. 354 mL 0   naloxone (NARCAN) nasal spray 4 mg/0.1 mL      omeprazole  (PRILOSEC) 20 MG capsule TAKE 1 CAPSULE BY MOUTH EVERY DAY 90 capsule 1   potassium chloride  (KLOR-CON  M) 10 MEQ tablet TAKE 1 TABLET TWICE DAILY 120 tablet 5   potassium chloride  (KLOR-CON ) 10 MEQ tablet Take by mouth.     pregabalin  (LYRICA ) 75 MG capsule Take 1 capsule (75 mg total) by mouth 2 (two) times daily. 60 capsule 0   simvastatin  (ZOCOR ) 40 MG tablet TAKE 1 TABLET EVERY DAY 90 tablet 3   tacrolimus  (PROTOPIC ) 0.1 % ointment APPLY TO AFFECTED AREA TOPICALLY EVERY DAY 100 g 1   traMADol  (ULTRAM ) 50 MG tablet Take 50 mg by mouth every 8 (eight) hours as needed.     traZODone  (DESYREL ) 50 MG tablet TAKE 100 MG BY MOUTH AT BEDTIME. 180 tablet 1   TRUE METRIX BLOOD GLUCOSE TEST test strip      valsartan  (DIOVAN ) 320 MG tablet Take 320 mg by mouth daily.     Vitamin D, Ergocalciferol, (DRISDOL) 1.25 MG (50000 UNIT) CAPS capsule Take 50,000 Units by mouth once a week.     No current facility-administered medications for this visit.      ___________________________________________________________________ Objective  Exam:  BP 110/78   Pulse 62   Ht 5' 4 (1.626 m)   Wt 164 lb (74.4 kg)   LMP  (LMP Unknown)   BMI 28.15 kg/m  Wt  Readings from Last 3 Encounters:  09/29/24 164 lb (74.4 kg)  07/20/24 163 lb (73.9 kg)  07/06/24 164 lb (74.4 kg)  Daughter Jolaine was here with her for the entire visit  General: Well-appearing Eyes: sclera anicteric, no redness ENT: oral mucosa moist without lesions, no cervical or supraclavicular lymphadenopathy, edentulous CV: Regular without appreciable murmur, no JVD, no peripheral edema Resp: clear to auscultation bilaterally, normal RR and effort noted GI: soft, mild scattered abdominal wall and xiphoid tenderness, with active bowel sounds. No guarding or palpable organomegaly noted. Skin; warm and dry, no rash or jaundice noted Neuro: awake, alert and oriented x 3. Normal gross motor function and fluent speech   Labs:     Latest Ref Rng & Units 07/20/2024   10:50 AM 01/20/2024   11:17 AM 07/06/2023    2:22 PM  CBC  WBC 4.0 - 10.5 K/uL 6.1  8.3  7.7   Hemoglobin 12.0 - 15.0 g/dL 86.9  88.0  87.1   Hematocrit 36.0 - 46.0 % 39.9  36.3  39.7   Platelets 150.0 - 400.0 K/uL 230.0  226.0  250.0       Latest Ref Rng & Units 07/20/2024   10:50 AM 01/20/2024   11:17 AM 07/06/2023    2:22 PM  CMP  Glucose 70 - 99 mg/dL 96  86  889   BUN 6 - 23 mg/dL 11  12  12    Creatinine 0.40 - 1.20 mg/dL 8.82  8.89  8.88   Sodium 135 - 145 mEq/L 141  142  141   Potassium 3.5 - 5.1 mEq/L 4.0  3.5  3.8   Chloride 96 - 112 mEq/L 107  107  106   CO2 19 - 32 mEq/L 28  30  27    Calcium 8.4 - 10.5 mg/dL 9.1  8.8  9.4   Total Protein 6.0 - 8.3 g/dL 6.9  6.9  7.1   Total Bilirubin 0.2 - 1.2 mg/dL 0.4  0.3  0.4   Alkaline Phos 39 - 117 U/L 37  39  41   AST 0 - 37 U/L 14  10  24    ALT 0 - 35 U/L 9  7  18     Albumin 3.8  Last TSH normal November 2024  Radiologic Studies:  CLINICAL DATA:  Chronic generalized abdominal pain for several years. Chronic diarrhea. Abnormal weight loss.   EXAM: CT ABDOMEN AND PELVIS WITH CONTRAST   TECHNIQUE: Multidetector CT imaging of the abdomen and pelvis  was performed using the standard protocol following bolus administration of intravenous contrast.   CONTRAST:  100mL OMNIPAQUE  IOHEXOL  300 MG/ML  SOLN   COMPARISON:  None.   FINDINGS: Lower Chest: No acute findings.   Hepatobiliary: No hepatic masses identified. Prior cholecystectomy. No evidence of biliary obstruction.   Pancreas:  No mass or inflammatory changes.   Spleen: Within normal limits in size and appearance.   Adrenals/Urinary Tract: No masses identified. No evidence of hydronephrosis.   Stomach/Bowel: No evidence of obstruction, inflammatory process or abnormal fluid collections.   Vascular/Lymphatic: No pathologically enlarged lymph nodes. 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 Aortic atherosclerosis.   Reproductive: Prior hysterectomy noted. Adnexal regions are unremarkable in appearance.   Other:  None.   Musculoskeletal:  No suspicious bone lesions identified.   IMPRESSION: 1. No evidence of neoplasm or other acute findings within the abdomen or pelvis. 2. 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 Recommend followup by ultrasound in 5 years. This recommendation follows ACR consensus guidelines: White Paper of the ACR Incidental Findings Committee II on Vascular Findings. J Am Coll Radiol 2013; 10:789-794. Aortic aneurysm NOS (ICD10-I71.9)     Electronically Signed   By: Norleen DELENA Kil M.D.   On: 03/30/2019 08:59   Encounter Diagnoses  Name Primary?   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 - Initiated bile acid binding agent in powder form, to be taken in the morning at least two hours before other medications.  This  should work if she is adherent to that timing since she typically takes all of her daily medicines about 10 AM.  Therefore she will take the cholestyramine  when she first wakes up hours before hand. - Provided instructions on timing to minimize absorption interference. - Advised monitoring response and reporting effects at next visit.  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  Difficult to characterize her stools, but it is like sometimes they may be oily or greasy.  No weight loss to suspect malabsorption. Fecal elastase were  History of colonic polyps Colonic polyps with appropriate surveillance interval; colonoscopy needed due to age and prior findings. - Initiated colonoscopy scheduling for polyp surveillance. She was agreeable after thorough discussion of procedure and risks  The benefits and risks of the planned procedure(s) were described in detail with the patient or (when appropriate) their health care proxy.  Risks were outlined as including, but not limited to, bleeding, infection, perforation, adverse medication reaction leading to cardiac or pulmonary decompensation, pancreatitis (if ERCP).  The limitation of incomplete mucosal visualization was also discussed.  No guarantees or warranties were given.   Thank you for the courtesy of this consult.  Please call me with any questions or concerns.  Victory LITTIE Brand III  CC: Referring provider noted above     [1]  Allergies Allergen Reactions   Ibuprofen Other (See Comments)    Gasteric issues   Tetracyclines & Related Other (See Comments)    Builds up fluid in the brain   Tetracyclines & Related Other (See Comments)    Demeclocycline-Fluid on the brain   "

## 2024-09-29 ENCOUNTER — Ambulatory Visit: Admitting: Gastroenterology

## 2024-09-29 ENCOUNTER — Encounter: Payer: Self-pay | Admitting: Gastroenterology

## 2024-09-29 VITALS — BP 110/78 | HR 62 | Ht 64.0 in | Wt 164.0 lb

## 2024-09-29 DIAGNOSIS — Z87891 Personal history of nicotine dependence: Secondary | ICD-10-CM | POA: Diagnosis not present

## 2024-09-29 DIAGNOSIS — Z860100 Personal history of colon polyps, unspecified: Secondary | ICD-10-CM | POA: Diagnosis not present

## 2024-09-29 DIAGNOSIS — K529 Noninfective gastroenteritis and colitis, unspecified: Secondary | ICD-10-CM

## 2024-09-29 DIAGNOSIS — R1084 Generalized abdominal pain: Secondary | ICD-10-CM | POA: Diagnosis not present

## 2024-09-29 MED ORDER — NA SULFATE-K SULFATE-MG SULF 17.5-3.13-1.6 GM/177ML PO SOLN: 1.0000 | Freq: Once | ORAL | 0 refills | Status: AC

## 2024-09-29 MED ORDER — CHOLESTYRAMINE 4 G PO PACK: 4.0000 g | PACK | Freq: Every morning | ORAL | 1 refills | Status: AC

## 2024-09-29 NOTE — Patient Instructions (Signed)
 You have been scheduled for a colonoscopy. Please follow written instructions given to you at your visit today.   If you use inhalers (even only as needed), please bring them with you on the day of your procedure.  DO NOT TAKE 7 DAYS PRIOR TO TEST- Trulicity (dulaglutide) Ozempic, Wegovy (semaglutide) Mounjaro, Zepbound (tirzepatide) Bydureon Bcise (exanatide extended release)  DO NOT TAKE 1 DAY PRIOR TO YOUR TEST Rybelsus (semaglutide) Adlyxin (lixisenatide) Victoza (liraglutide) Byetta (exanatide) ___________________________________________________________________________  We have sent the following medications to your pharmacy for you to pick up at your convenience: Questran  Powder   Your provider has requested that you go to the basement level for lab work before leaving today. Press B on the elevator. The lab is located at the first door on the left as you exit the elevator.

## 2024-10-06 ENCOUNTER — Ambulatory Visit: Admitting: Podiatry

## 2024-10-26 ENCOUNTER — Ambulatory Visit: Admitting: Podiatry

## 2024-11-16 ENCOUNTER — Encounter: Admitting: Gastroenterology

## 2024-12-20 ENCOUNTER — Ambulatory Visit: Admitting: Neurology

## 2025-01-17 ENCOUNTER — Ambulatory Visit: Admitting: Emergency Medicine

## 2025-04-13 ENCOUNTER — Ambulatory Visit
